# Patient Record
Sex: Male | Born: 1975
Health system: Southern US, Community
[De-identification: ages and names within clinical notes are randomized; demographics above are authoritative.]

## PROBLEM LIST (undated history)

## (undated) DIAGNOSIS — B181 Chronic viral hepatitis B without delta-agent: Secondary | ICD-10-CM

## (undated) HISTORY — PX: HERNIA REPAIR: SHX51

## (undated) HISTORY — DX: Chronic viral hepatitis B without delta-agent: B18.1

---

## 2011-03-28 ENCOUNTER — Ambulatory Visit: Payer: BC Managed Care – PPO

## 2011-03-28 ENCOUNTER — Emergency Department (HOSPITAL_COMMUNITY)
Admission: EM | Admit: 2011-03-28 | Discharge: 2011-03-28 | Disposition: A | Discharge status: Home / Self Care | Payer: BC Managed Care – PPO | Source: Home / Self Care | Attending: Family Medicine | Admitting: Family Medicine

## 2011-03-29 ENCOUNTER — Other Ambulatory Visit: Payer: Self-pay | Admitting: Family Medicine

## 2011-10-20 ENCOUNTER — Ambulatory Visit (INDEPENDENT_AMBULATORY_CARE_PROVIDER_SITE_OTHER): Payer: BC Managed Care – PPO | Admitting: Family Medicine

## 2011-10-20 VITALS — BP 108/64 | HR 104 | Temp 98.3°F | Resp 18 | Ht 67.0 in | Wt 160.0 lb

## 2011-10-20 DIAGNOSIS — R748 Abnormal levels of other serum enzymes: Secondary | ICD-10-CM

## 2011-10-20 DIAGNOSIS — D72819 Decreased white blood cell count, unspecified: Secondary | ICD-10-CM

## 2011-10-20 DIAGNOSIS — B3749 Other urogenital candidiasis: Secondary | ICD-10-CM

## 2011-10-20 DIAGNOSIS — R946 Abnormal results of thyroid function studies: Secondary | ICD-10-CM

## 2011-10-20 DIAGNOSIS — B3789 Other sites of candidiasis: Secondary | ICD-10-CM

## 2011-10-20 DIAGNOSIS — R21 Rash and other nonspecific skin eruption: Secondary | ICD-10-CM

## 2011-10-20 LAB — COMPREHENSIVE METABOLIC PANEL
Albumin: 4.6 g/dL (ref 3.5–5.2)
BUN: 9 mg/dL (ref 6–23)
Calcium: 9.6 mg/dL (ref 8.4–10.5)
Chloride: 104 mEq/L (ref 96–112)
Glucose, Bld: 81 mg/dL (ref 70–99)
Potassium: 4.3 mEq/L (ref 3.5–5.3)

## 2011-10-20 LAB — POCT CBC
Granulocyte percent: 50.1 %G (ref 37–80)
HCT, POC: 50 % (ref 43.5–53.7)
MCH, POC: 25.9 pg — AB (ref 27–31.2)
MCV: 81.9 fL (ref 80–97)
POC LYMPH PERCENT: 40.3 %L (ref 10–50)
RBC: 6.11 M/uL (ref 4.69–6.13)
RDW, POC: 15.3 %
WBC: 5.3 10*3/uL (ref 4.6–10.2)

## 2011-10-20 LAB — TSH: TSH: 0.971 u[IU]/mL (ref 0.350–4.500)

## 2011-10-20 LAB — POCT SKIN KOH: Skin KOH, POC: NEGATIVE

## 2011-10-20 MED ORDER — NYSTATIN 100000 UNIT/GM EX CREA: TOPICAL_CREAM | Freq: Two times a day (BID) | CUTANEOUS | Status: DC

## 2011-10-20 NOTE — Patient Instructions (Addendum)
1. Rash  POCT Skin KOH, Comprehensive metabolic panel  2. Thyroid function test abnormal  Comprehensive metabolic panel, TSH  3. Leukocytopenia  POCT CBC, Comprehensive metabolic panel, TSH  4. Candidiasis of anus    5. Candidiasis of urogenital sites  nystatin cream (MYCOSTATIN)  6. Elevated alkaline phosphatase level     Candida Infection, Adult A candida infection (also called yeast, fungus and Monilia infection) is an overgrowth of yeast that can occur anywhere on the body. A yeast infection commonly occurs in warm, moist body areas. Usually, the infection remains localized but can spread to become a systemic infection. A yeast infection may be a sign of a more severe disease such as diabetes, leukemia, or AIDS. A yeast infection can occur in both men and women. In women, Candida vaginitis is a vaginal infection. It is one of the most common causes of vaginitis. Men usually do not have symptoms or know they have an infection until other problems develop. Men may find out they have a yeast infection because their sex partner has a yeast infection. Uncircumcised men are more likely to get a yeast infection than circumcised men. This is because the uncircumcised glans is not exposed to air and does not remain as dry as that of a circumcised glans. Older adults may develop yeast infections around dentures. CAUSES  Women  Antibiotics.   Steroid medication taken for a long time.   Being overweight (obese).   Diabetes.   Poor immune condition.   Certain serious medical conditions.   Immune suppressive medications for organ transplant patients.   Chemotherapy.   Pregnancy.   Menstration.   Stress and fatigue.   Intravenous drug use.   Oral contraceptives.   Wearing tight-fitting clothes in the crotch area.   Catching it from a sex partner who has a yeast infection.   Spermicide.   Intravenous, urinary, or other catheters.  Men  Catching it from a sex partner who has a  yeast infection.   Having oral or anal sex with a person who has the infection.   Spermicide.   Diabetes.   Antibiotics.   Poor immune system.   Medications that suppress the immune system.   Intravenous drug use.   Intravenous, urinary, or other catheters.  SYMPTOMS  Women  Thick, white vaginal discharge.   Vaginal itching.   Redness and swelling in and around the vagina.   Irritation of the lips of the vagina and perineum.   Blisters on the vaginal lips and perineum.   Painful sexual intercourse.   Low blood sugar (hypoglycemia).   Painful urination.   Bladder infections.   Intestinal problems such as constipation, indigestion, bad breath, bloating, increase in gas, diarrhea, or loose stools.  Men  Men may develop intestinal problems such as constipation, indigestion, bad breath, bloating, increase in gas, diarrhea, or loose stools.   Dry, cracked skin on the penis with itching or discomfort.   Jock itch.   Dry, flaky skin.   Athlete's foot.   Hypoglycemia.  DIAGNOSIS  Women  A history and an exam are performed.   The discharge may be examined under a microscope.   A culture may be taken of the discharge.  Men  A history and an exam are performed.   Any discharge from the penis or areas of cracked skin will be looked at under the microscope and cultured.   Stool samples may be cultured.  TREATMENT  Women  Vaginal antifungal suppositories and creams.  Medicated creams to decrease irritation and itching on the outside of the vagina.   Warm compresses to the perineal area to decrease swelling and discomfort.   Oral antifungal medications.   Medicated vaginal suppositories or cream for repeated or recurrent infections.   Wash and dry the irritation areas before applying the cream.   Eating yogurt with lactobacillus may help with prevention and treatment.   Sometimes painting the vagina with gentian violet solution may help if creams  and suppositories do not work.  Men  Antifungal creams and oral antifungal medications.   Sometimes treatment must continue for 30 days after the symptoms go away to prevent recurrence.  HOME CARE INSTRUCTIONS  Women  Use cotton underwear and avoid tight-fitting clothing.   Avoid colored, scented toilet paper and deodorant tampons or pads.   Do not douche.   Keep your diabetes under control.   Finish all the prescribed medications.   Keep your skin clean and dry.   Consume milk or yogurt with lactobacillus active culture regularly. If you get frequent yeast infections and think that is what the infection is, there are over-the-counter medications that you can get. If the infection does not show healing in 3 days, talk to your caregiver.   Tell your sex partner you have a yeast infection. Your partner may need treatment also, especially if your infection does not clear up or recurs.  Men  Keep your skin clean and dry.   Keep your diabetes under control.   Finish all prescribed medications.   Tell your sex partner that you have a yeast infection so they can be treated if necessary.  SEEK MEDICAL CARE IF:   Your symptoms do not clear up or worsen in one week after treatment.   You have an oral temperature above 102 F (38.9 C).   You have trouble swallowing or eating for a prolonged time.   You develop blisters on and around your vagina.   You develop vaginal bleeding and it is not your menstrual period.   You develop abdominal pain.   You develop intestinal problems as mentioned above.   You get weak or lightheaded.   You have painful or increased urination.   You have pain during sexual intercourse.  MAKE SURE YOU:   Understand these instructions.   Will watch your condition.   Will get help right away if you are not doing well or get worse.  Document Released: 02/20/2004 Document Revised: 01/01/2011 Document Reviewed: 06/03/2009 [facility] Patient  Information 2012 [place], [place].

## 2011-10-20 NOTE — Progress Notes (Signed)
[address]   [place], [place]  [address]   [phone]  Subjective:    Patient ID: Jonathon Miles, male    DOB: Jun 30, 1975, 36 y.o.   MRN: [id number]  HPIThis 36 y.o. male presents for evaluation of the following:  1.  Anal itching:  Onset 4-5 months; used cream from home with pain with use.  No bleeding with bowel movement.  +itching during class.  No nighttime itching.  Works at night.  No worsening during sleeping hours.  Lives alone.  No apparent rash.  No use of Tucks pads or Preparation H.   Also has itchy rash along R groin region.  2.  Labwork:  Requesting labs.  At last visit, checked labs and reported that sugar elevated.  Last visit 07/27/2010.  After review of last labs 07/2010, alkaline phosphatase abnormal, TSH abnormally low, WBC slightly low.  Last HIV testing five years ago before moving to [place];result was negative.  PMH:  None Psurg: SBO surgery. 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  One child.   Review of Systems  Constitutional: Negative for fever, diaphoresis and fatigue.  Gastrointestinal: Negative for nausea, vomiting, abdominal pain, diarrhea, constipation, blood in stool, anal bleeding and rectal pain.  Skin: Negative for color change, pallor, rash and wound.       Objective:   Physical Exam  Nursing note and vitals reviewed. Constitutional: He is oriented to person, place, and time. He appears well-developed and well-nourished. No distress.  Eyes: Conjunctivae normal are normal. Pupils are equal, round, and reactive to light.  Cardiovascular: Normal rate, regular rhythm and normal heart sounds.   Pulmonary/Chest: Effort normal and breath sounds normal. No respiratory distress. He has no wheezes. He has no rales.  Abdominal: Soft. Bowel sounds are normal. He exhibits no distension and no mass. There is no tenderness.  There is no rebound and no guarding.  Genitourinary: Rectal exam shows no external hemorrhoid.  Neurological: He is alert and oriented to person, place, and time.  Skin: He is not diaphoretic.       WHITE SCALING RASH PERIANAL REGION WITH WELL DEFINED BORDERS; NO ASSOCIATED HEMORRHOIDS.  HYPOPIGMENTED/WHITE RASH R INGUINAL/GROIN REGION; NO VESICLES OR PUSTULES.  Psychiatric: He has a normal mood and affect. His behavior is normal. Judgment and thought content normal.     Results for orders placed in visit on 10/20/11  POCT SKIN KOH      Component Value Range   Skin KOH, POC Negative    POCT CBC      Component Value Range   WBC 5.3  4.6 - 10.2 K/uL   Lymph, poc 2.1  0.6 - 3.4   POC LYMPH PERCENT 40.3  10 - 50 %L   MID (cbc) 0.5  0 - 0.9   POC MID % 9.6  0 - 12 %M   POC Granulocyte 2.7  2 - 6.9   Granulocyte percent 50.1  37 - 80 %G   RBC 6.11  4.69 - 6.13 M/uL   Hemoglobin 15.8  14.1 - 18.1 g/dL   HCT, POC 53.6  64.4 - 53.7 %   MCV 81.9  80 - 97 fL   MCH, POC 25.9 (*) 27 - 31.2 pg   MCHC 31.6 (*) 31.8 - 35.4 g/dL   RDW, POC 03.4     Platelet Count, POC 176  142 - 424 K/uL   MPV 10.2  0 - 99.8 fL        Assessment & Plan:   1. Rash  POCT Skin KOH, Comprehensive metabolic panel  2. Thyroid function test abnormal  Comprehensive metabolic panel, TSH  3. Leukocytopenia  POCT CBC, Comprehensive metabolic panel, TSH  4. Candidiasis of anus    5. Candidiasis of urogenital sites  nystatin cream (MYCOSTATIN)  6. Elevated alkaline phosphatase level       1.  Candidiasis Anus: New.  Treat with Nystatin cream for two weeks; RTC in two weeks if no improvement. 2.  Candidiasis Urogenital region/R groin: New.  Treat with Nystatin cream. 3.  Thyroid function abnormal: New in 07/2010.  Repeat labs today. Asymptomatic. 4. Leukocytopenia: New in 07/2010.  Repeat today normal. 5.  Elevated alkaline phosphatase: New in 2012.  Repeat today.

## 2011-10-25 NOTE — Progress Notes (Signed)
Reviewed and agree.

## 2011-10-28 ENCOUNTER — Encounter: Payer: Self-pay | Admitting: Family Medicine

## 2012-10-03 ENCOUNTER — Ambulatory Visit (INDEPENDENT_AMBULATORY_CARE_PROVIDER_SITE_OTHER): Payer: BC Managed Care – PPO | Admitting: Emergency Medicine

## 2012-10-03 ENCOUNTER — Telehealth: Payer: Self-pay | Admitting: Radiology

## 2012-10-03 VITALS — BP 118/70 | HR 67 | Temp 97.1°F | Resp 16

## 2012-10-03 DIAGNOSIS — R21 Rash and other nonspecific skin eruption: Secondary | ICD-10-CM

## 2012-10-03 DIAGNOSIS — J309 Allergic rhinitis, unspecified: Secondary | ICD-10-CM

## 2012-10-03 LAB — POCT SKIN KOH: Skin KOH, POC: NEGATIVE

## 2012-10-03 MED ORDER — FLUTICASONE PROPIONATE 50 MCG/ACT NA SUSP: 2.0000 | Freq: Every day | NASAL | Status: DC

## 2012-10-03 MED ORDER — TRIAMCINOLONE 0.1 % CREAM:EUCERIN CREAM 1:1: 1.0000 "application " | TOPICAL_CREAM | Freq: Two times a day (BID) | CUTANEOUS | Status: DC | PRN

## 2012-10-03 NOTE — Patient Instructions (Addendum)

## 2012-10-03 NOTE — Progress Notes (Signed)
  Subjective:    Patient ID: Jonathon Miles, male    DOB: 05-09-1975, 37 y.o.   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 Has also had cough. Has tried ibuprofen w/o relief. Is having trouble with stomach making noises. Denies any nausea.   Patient also complains of feeling itchy all over. Was seen for this last year and prescribed nystatin cream which helped some. Has even seen a dermatologist for this and was told it was dry skin. Denies any changes in diet, soaps, detergents. Started on legs and spread all over.   Review of Systems     Objective:   Physical Exam HEENT exam is unremarkable there is some mild nasal congestion his chest is clear his heart regular rate no murmurs abdomen soft nontender extremity exam reveals a dry scaly patch right posterior calf just below the popliteal fossa.  Results for orders placed in visit on 10/03/12  POCT SKIN KOH      Result Value Range   Skin KOH, POC Negative          Assessment & Plan:  We'll treat with use and triamcinolone mixed for the rash on his right leg. I advised him to get Claritin to take 1 daily along with some Flonase spray

## 2012-10-03 NOTE — Telephone Encounter (Signed)
Clarified triamcinolone.

## 2013-06-01 ENCOUNTER — Ambulatory Visit (INDEPENDENT_AMBULATORY_CARE_PROVIDER_SITE_OTHER): Payer: BC Managed Care – PPO | Admitting: Family Medicine

## 2013-06-01 ENCOUNTER — Ambulatory Visit: Payer: BC Managed Care – PPO

## 2013-06-01 VITALS — BP 124/80 | HR 69 | Temp 98.6°F | Resp 16 | Ht 66.5 in | Wt 172.0 lb

## 2013-06-01 DIAGNOSIS — B3749 Other urogenital candidiasis: Secondary | ICD-10-CM

## 2013-06-01 DIAGNOSIS — M549 Dorsalgia, unspecified: Secondary | ICD-10-CM

## 2013-06-01 DIAGNOSIS — R21 Rash and other nonspecific skin eruption: Secondary | ICD-10-CM

## 2013-06-01 LAB — POCT UA - MICROSCOPIC ONLY
Bacteria, U Microscopic: NEGATIVE
Casts, Ur, LPF, POC: NEGATIVE
Crystals, Ur, HPF, POC: NEGATIVE
Mucus, UA: NEGATIVE
RBC, urine, microscopic: NEGATIVE
Yeast, UA: NEGATIVE

## 2013-06-01 LAB — POCT URINALYSIS DIPSTICK
Bilirubin, UA: NEGATIVE
Blood, UA: NEGATIVE
Glucose, UA: NEGATIVE
Ketones, UA: NEGATIVE
Leukocytes, UA: NEGATIVE
Nitrite, UA: NEGATIVE
Protein, UA: NEGATIVE
Spec Grav, UA: 1.02
Urobilinogen, UA: 0.2
pH, UA: 7

## 2013-06-01 MED ORDER — NYSTATIN 100000 UNIT/GM EX POWD: CUTANEOUS | Status: DC

## 2013-06-01 MED ORDER — NYSTATIN 100000 UNIT/GM EX CREA: TOPICAL_CREAM | Freq: Every day | CUTANEOUS | Status: DC

## 2013-06-01 NOTE — Progress Notes (Signed)
 Chief Complaint:  Chief Complaint  Patient presents with  . Back Pain    Lower, X 6 months    HPI: Jonathon Miles is a 38 y.o. male who is here for: 1. Lower back pain , intermittent, has been occurring for last 6 months, dull with sharp pain in low back pain, he works Location managermachine operator, he has to lift  A lot, when this happens at work he stops and waits and it will go away in several seconds to minutes. He has tried nothing for , he has no urinary sxs, he would like to check   2. He has groin itching, no other sxs. He sweats some, not excessive. No swollen glands, fevers or chills  History reviewed. No pertinent past medical history. History reviewed. No pertinent past surgical history. History   Social History  . Marital Status: Single    Spouse Name: N/A    Number of Children: N/A  . Years of Education: N/A   Social History Main Topics  . Smoking status: Never Smoker   . Smokeless tobacco: None  . Alcohol Use: No  . Drug Use: No  . Sexual Activity: None   Other Topics Concern  . None   Social History Narrative  . None   History reviewed. No pertinent family history. No Known Allergies Prior to Admission medications   Medication Sig Start Date End Date Taking? Authorizing Provider  fluticasone (FLONASE) 50 MCG/ACT nasal spray Place 2 sprays into the nose daily. 10/03/12   Collene GobbleSteven A Daub, MD  nystatin cream (MYCOSTATIN) Apply topically 2 (two) times daily. 10/20/11   Ethelda ChickKristi M Smith, MD  Triamcinolone Acetonide (TRIAMCINOLONE 0.1 % CREAM : EUCERIN) CREA Apply 1 application topically 2 (two) times daily as needed. 10/03/12   Collene GobbleSteven A Daub, MD     ROS: The patient denies fevers, chills, night sweats, unintentional weight loss, chest pain, palpitations, wheezing, dyspnea on exertion, nausea, vomiting, abdominal pain, dysuria, hematuria, melena, numbness, weakness, or tingling.  All other systems have been reviewed and were otherwise negative with the exception of  those mentioned in the HPI and as above.    PHYSICAL EXAM: Filed Vitals:   06/01/13 0843  BP: 124/80  Pulse: 69  Temp: 98.6 F (37 C)  Resp: 16   Filed Vitals:   06/01/13 0843  Height: 5' 6.5" (1.689 m)  Weight: 172 lb (78.019 kg)   Body mass index is 27.35 kg/(m^2).  General: Alert, no acute distress HEENT:  Normocephalic, atraumatic, oropharynx patent. EOMI, PERRLA Cardiovascular:  Regular rate and rhythm, no rubs murmurs or gallops.  No Carotid bruits, radial pulse intact. No pedal edema.  Respiratory: Clear to auscultation bilaterally.  No wheezes, rales, or rhonchi.  No cyanosis, no use of accessory musculature GI: No organomegaly, abdomen is soft and non-tender, positive bowel sounds.  No masses. Skin: + bilateral inguinal fungal rash Neurologic: Facial musculature symmetric. Psychiatric: Patient is appropriate throughout our interaction. Lymphatic: No cervical lymphadenopathy Musculoskeletal: Gait intact. No  paramsk tenderness  Full ROM 5/5 strength, 2/2 DTRs No saddle anesthesia Straight leg negative Hip and knee exam--normal    LABS: Results for orders placed in visit on 06/01/13  POCT UA - MICROSCOPIC ONLY      Result Value Ref Range   WBC, Ur, HPF, POC 0-1     RBC, urine, microscopic neg     Bacteria, U Microscopic neg     Mucus, UA neg     Epithelial cells, urine  per micros 0-1     Crystals, Ur, HPF, POC neg     Casts, Ur, LPF, POC neg     Yeast, UA neg    POCT URINALYSIS DIPSTICK      Result Value Ref Range   Color, UA yellow     Clarity, UA clear     Glucose, UA neg     Bilirubin, UA neg     Ketones, UA neg     Spec Grav, UA 1.020     Blood, UA neg     pH, UA 7.0     Protein, UA neg     Urobilinogen, UA 0.2     Nitrite, UA neg     ukocytes, UA Negative       EKG/XRAY:   Primary read [facility]. Min scoliosis vs positioional No fx/dislocation   ASSESSMENT/PLAN: Encounter Diagnoses  Name Primary?  . Back pain  Yes  . Rash and nonspecific skin eruption   . Candidiasis of urogenital sites    Rx nystatin powder in the AM, cream in the PM Back exercises for strengthening F/u prn  Gross sideeffects, risk and benefits, and alternatives of medications d/w patient. Patient is aware that all medications have potential sideeffects and we are unable to predict every sideeffect or drug-drug interaction that may occur.  nell Antuhao P , DO 06/01/2013 10:57 AM

## 2013-10-26 ENCOUNTER — Ambulatory Visit (INDEPENDENT_AMBULATORY_CARE_PROVIDER_SITE_OTHER): Payer: BC Managed Care – PPO | Admitting: Physician Assistant

## 2013-10-26 VITALS — BP 118/78 | HR 72 | Temp 97.6°F | Resp 18 | Ht 68.0 in | Wt 178.0 lb

## 2013-10-26 DIAGNOSIS — Z13220 Encounter for screening for lipoid disorders: Secondary | ICD-10-CM

## 2013-10-26 DIAGNOSIS — Z0000 Encounter for general adult medical examination without abnormal findings: Secondary | ICD-10-CM

## 2013-10-26 DIAGNOSIS — Z131 Encounter for screening for diabetes mellitus: Secondary | ICD-10-CM

## 2013-10-26 LAB — COMPLETE METABOLIC PANEL WITH GFR
ALK PHOS: 82 U/L (ref 39–117)
ALT: 27 U/L (ref 0–53)
AST: 24 U/L (ref 0–37)
Albumin: 4.4 g/dL (ref 3.5–5.2)
BILIRUBIN TOTAL: 0.5 mg/dL (ref 0.2–1.2)
BUN: 9 mg/dL (ref 6–23)
CO2: 29 mEq/L (ref 19–32)
CREATININE: 1.01 mg/dL (ref 0.50–1.35)
Calcium: 9.6 mg/dL (ref 8.4–10.5)
Chloride: 102 mEq/L (ref 96–112)
GFR, Est African American: >89 mL/min
GFR, Est Non African American: >89 mL/min
GLUCOSE: 95 mg/dL (ref 70–99)
Potassium: 3.9 mEq/L (ref 3.5–5.3)
Sodium: 138 mEq/L (ref 135–145)
Total Protein: 7.1 g/dL (ref 6.0–8.3)

## 2013-10-26 LAB — POCT URINALYSIS DIPSTICK
Bilirubin, UA: NEGATIVE
Blood, UA: NEGATIVE
Glucose, UA: NEGATIVE
Ketones, UA: NEGATIVE
LEUKOCYTES UA: NEGATIVE
Nitrite, UA: NEGATIVE
Protein, UA: NEGATIVE
SPEC GRAV UA: 1.01
UROBILINOGEN UA: 0.2
pH, UA: 6

## 2013-10-26 LAB — LIPID PANEL
CHOL/HDL RATIO: 3.9 ratio
Cholesterol: 158 mg/dL (ref 0–200)
HDL: 41 mg/dL (ref >39)
LDL Cholesterol: 99 mg/dL (ref 0–99)
Triglycerides: 88 mg/dL (ref <150)
VLDL: 18 mg/dL (ref 0–40)

## 2013-10-26 LAB — HEMOGLOBIN A1C
Hgb A1c MFr Bld: 6.1 % — ABNORMAL HIGH (ref <5.7)
Mean Plasma Glucose: 128 mg/dL — ABNORMAL HIGH (ref <117)

## 2013-10-26 NOTE — Patient Instructions (Signed)
I will contact you with your lab results as soon as they are available.   If you have not heard from me in 2 weeks, please contact me.  The fastest way to get your results is to register for My Chart (see the instructions on the last page of this printout).   

## 2013-10-26 NOTE — Progress Notes (Signed)
Subjective:    Patient ID: Jonathon Miles, male    DOB: Dec 27, 1975, 38 y.o.   MRN: [id number]  HPI  Pt presents to clinic for a CPE.  His insurance requires him to have this yearly and he needs labs.  He is not having any problems today.  Dentist - about 6 months ago Eye - never Not current sexually active  Review of Systems  Constitutional: Negative.   HENT: Negative.   Eyes: Negative.   Respiratory: Negative.   Cardiovascular: Negative.   Gastrointestinal: Negative.   Endocrine: Negative.   Genitourinary: Negative.   Musculoskeletal: Negative.   Skin: Negative.   Allergic/Immunologic: Negative.   Neurological: Negative.   Hematological: Negative.   Psychiatric/Behavioral: Negative.        Objective:   Physical Exam  Vitals reviewed. Constitutional: He is oriented to person, place, and time. He appears well-developed and well-nourished.  BP 118/78  Pulse 72  Temp(Src) 97.6 F (36.4 C) (Oral)  Resp 18  Ht 5\' 8"  (1.727 m)  Wt 178 lb (80.74 kg)  BMI 27.07 kg/m2  SpO2 98%   HENT:  Head: Normocephalic and atraumatic.  Right Ear: Hearing, tympanic membrane, external ear and ear canal normal.  Left Ear: Hearing, tympanic membrane, external ear and ear canal normal.  Nose: Nose normal.  Mouth/Throat: Uvula is midline, oropharynx is clear and moist and mucous membranes are normal.  Eyes: Conjunctivae and EOM are normal. Pupils are equal, round, and reactive to light.  Neck: Normal range of motion. Neck supple. No mass and no thyromegaly present.  Cardiovascular: Normal rate, regular rhythm and normal heart sounds.   No murmur heard. Pulmonary/Chest: Effort normal and breath sounds normal. He has no wheezes.  Abdominal: Soft. Bowel sounds are normal. Hernia confirmed negative in the right inguinal area and confirmed negative in the left inguinal area.  Genitourinary: Testes normal and penis normal. Circumcised.  Musculoskeletal: Normal range of motion.    Lymphadenopathy:       Head (right side): No tonsillar and no occipital adenopathy present.       Head (left side): No tonsillar and no occipital adenopathy present.    He has no cervical adenopathy.       Right: No supraclavicular adenopathy present.       Left: No supraclavicular adenopathy present.  Neurological: He is alert and oriented to person, place, and time. He has normal reflexes.  Skin: Skin is warm and dry.     Psychiatric: He has a normal mood and affect. His behavior is normal. Judgment and thought content normal.   Results for orders placed in visit on 10/26/13  POCT URINALYSIS DIPSTICK      Result Value Ref Range   Color, UA yellow     Clarity, UA clear     Glucose, UA neg     Bilirubin, UA neg     Ketones, UA neg     Spec Grav, UA 1.010     Blood, UA neg     pH, UA 6.0     Protein, UA neg     Urobilinogen, UA 0.2     Nitrite, UA neg     Leukocytes, UA Negative         Assessment & Plan:  Annual physical exam - Plan: COMPLETE METABOLIC PANEL WITH GFR, Nicotine/cotinine metabolites, POCT urinalysis dipstick  Screening cholesterol level - Plan: Lipid panel  Screening for diabetes mellitus - Plan: Hemoglobin A1c  Benny LennertSarah Danique Hartsough PA-C  [facility] 10/26/2013 6:42 PM

## 2013-10-31 LAB — NICOTINE/COTININE METABOLITES: Cotinine: <10 ng/mL

## 2013-12-19 ENCOUNTER — Emergency Department (HOSPITAL_COMMUNITY)
Admission: EM | Admit: 2013-12-19 | Discharge: 2013-12-19 | Disposition: A | Discharge status: Home / Self Care | Payer: BC Managed Care – PPO | Source: Home / Self Care | Attending: Family Medicine | Admitting: Family Medicine

## 2013-12-19 ENCOUNTER — Encounter (HOSPITAL_COMMUNITY): Payer: Self-pay | Admitting: Emergency Medicine

## 2013-12-19 DIAGNOSIS — K529 Noninfective gastroenteritis and colitis, unspecified: Secondary | ICD-10-CM

## 2013-12-19 MED ORDER — ONDANSETRON HCL 4 MG/2ML IJ SOLN
INTRAMUSCULAR | Status: AC
  Filled 2013-12-19: qty 2

## 2013-12-19 MED ORDER — SODIUM CHLORIDE 0.9 % IV BOLUS (SEPSIS)
1000.0000 mL | Freq: Once | INTRAVENOUS | Status: AC
  Administered 2013-12-19: 1000 mL via INTRAVENOUS

## 2013-12-19 MED ORDER — ONDANSETRON HCL 4 MG PO TABS: 4.0000 mg | ORAL_TABLET | Freq: Four times a day (QID) | ORAL | Status: DC

## 2013-12-19 MED ORDER — ONDANSETRON HCL 4 MG/2ML IJ SOLN
4.0000 mg | Freq: Once | INTRAMUSCULAR | Status: AC
  Administered 2013-12-19: 4 mg via INTRAVENOUS

## 2013-12-19 NOTE — ED Notes (Signed)
C/o  Acute onset of vomiting and diarrhea last night denies changes in diet.  No sick contacts that he is aware of.    Also c/o rash under neck x 2 wks with color changes.  Denies irriation.

## 2013-12-19 NOTE — ED Provider Notes (Signed)
CSN: [id number]     Arrival date & time 12/19/13  [id number] History   None    Chief Complaint  Patient presents with  . Abdominal Pain  . Rash   (Consider location/radiation/quality/duration/timing/severity/associated sxs/prior Treatment) Patient is a 38 y.o. male presenting with abdominal pain. The history is provided by the patient.  Abdominal Pain Pain location:  Epigastric Pain quality: cramping and sharp   Pain radiates to:  Does not radiate Pain severity:  Mild Onset quality:  Sudden Duration:  1 day Progression:  Worsening Chronicity:  New Relieved by:  None tried Worsened by:  Eating Ineffective treatments:  None tried Associated symptoms: anorexia, diarrhea, nausea and vomiting   Associated symptoms: no chills, no fever and no hematochezia     History reviewed. No pertinent past medical history. Past Surgical History  Procedure Laterality Date  . Hernia repair Left    History reviewed. No pertinent family history. History  Substance Use Topics  . Smoking status: Never Smoker   . Smokeless tobacco: Not on file  . Alcohol Use: No    Review of Systems  Constitutional: Negative for fever and chills.  Respiratory: Negative.   Cardiovascular: Negative.   Gastrointestinal: Positive for nausea, vomiting, abdominal pain, diarrhea and anorexia. Negative for blood in stool and hematochezia.    Allergies  Review of patient's allergies indicates no known allergies.  Home Medications   Prior to Admission medications   Medication Sig Start Date End Date Taking? Authorizing Provider  ondansetron (ZOFRAN) 4 MG tablet Take 1 tablet (4 mg total) by mouth every 6 (six) hours. Prn n/v. 12/19/13   Linna HoffJames D Kindl, MD   BP 176/81 mmHg  Pulse 71  Temp(Src) 98 F (36.7 C) (Oral)  Resp 16  SpO2 96% Physical Exam  Constitutional: He is oriented to person, place, and time. He appears well-developed and well-nourished.  HENT:  Mouth/Throat: Oropharynx is clear and moist. Mucous  membranes are dry.  Eyes: EOM are normal. Pupils are equal, round, and reactive to light.  Neck: Normal range of motion. Neck supple.  Pulmonary/Chest: Effort normal and breath sounds normal.  Abdominal: Soft. Normal appearance. He exhibits no distension and no mass. Bowel sounds are decreased. There is tenderness in the epigastric area. There is no rigidity, no rebound, no guarding and no CVA tenderness.  Lymphadenopathy:    He has no cervical adenopathy.  Neurological: He is alert and oriented to person, place, and time.  Skin: Skin is warm and dry.  Nursing note and vitals reviewed.   ED Course  Procedures (including critical care time) Labs Review Labs Reviewed - No data to display  Imaging Review No results found.   MDM   1. Gastroenteritis, acute    Sx much improved after ivf, vomiting resolved.    Linna HoffJames D Kindl, MD 12/19/13 [phone]

## 2013-12-19 NOTE — Discharge Instructions (Signed)
Clear liquid , bland diet tonight as tolerated, gatorade and soup, advance on wed as improved, use medicine as needed, imodium for diarrhea. return or see your doctor if any problems.

## 2014-11-30 ENCOUNTER — Ambulatory Visit (INDEPENDENT_AMBULATORY_CARE_PROVIDER_SITE_OTHER): Payer: BLUE CROSS/BLUE SHIELD | Admitting: Family Medicine

## 2014-11-30 ENCOUNTER — Encounter: Payer: Self-pay | Admitting: Family Medicine

## 2014-11-30 VITALS — BP 109/88 | HR 80 | Temp 98.2°F | Ht 68.0 in | Wt 174.0 lb

## 2014-11-30 DIAGNOSIS — Z23 Encounter for immunization: Secondary | ICD-10-CM | POA: Diagnosis not present

## 2014-11-30 DIAGNOSIS — R7303 Prediabetes: Secondary | ICD-10-CM | POA: Diagnosis not present

## 2014-11-30 DIAGNOSIS — Z0000 Encounter for general adult medical examination without abnormal findings: Secondary | ICD-10-CM

## 2014-11-30 LAB — COMPREHENSIVE METABOLIC PANEL
ALT: 23 U/L (ref 9–46)
AST: 23 U/L (ref 10–40)
Albumin: 4.2 g/dL (ref 3.6–5.1)
Alkaline Phosphatase: 83 U/L (ref 40–115)
BUN: 11 mg/dL (ref 7–25)
CHLORIDE: 101 mmol/L (ref 98–110)
CO2: 31 mmol/L (ref 20–31)
Calcium: 9.5 mg/dL (ref 8.6–10.3)
Creat: 1.05 mg/dL (ref 0.60–1.35)
Glucose, Bld: 97 mg/dL (ref 65–99)
Potassium: 4.4 mmol/L (ref 3.5–5.3)
Sodium: 136 mmol/L (ref 135–146)
Total Bilirubin: 0.6 mg/dL (ref 0.2–1.2)
Total Protein: 6.8 g/dL (ref 6.1–8.1)

## 2014-11-30 LAB — LIPID PANEL
CHOL/HDL RATIO: 4 ratio (ref <=5.0)
Cholesterol: 144 mg/dL (ref 125–200)
HDL: 36 mg/dL — ABNORMAL LOW (ref >=40)
LDL CALC: 92 mg/dL (ref <130)
Triglycerides: 80 mg/dL (ref <150)
VLDL: 16 mg/dL (ref <30)

## 2014-11-30 LAB — POCT GLYCOSYLATED HEMOGLOBIN (HGB A1C): Hemoglobin A1C: 6

## 2014-11-30 NOTE — Patient Instructions (Addendum)
Checking labs Follow up in 1 year, sooner if needed.  Be well, Dr. Pollie MeyerMcIntyre

## 2014-11-30 NOTE — Progress Notes (Signed)
  HPI:  Patient presents today for a new patient appointment to establish general primary care, also to have a complete physical.  Patient has no specific concerns today. Reports he is up to date on his vaccines. His insurance requires him to get a physical each year. He is originally from [place], primarily language is [place] but he speaks fluent [place]. Works full time, and is a Engineer, maintenance (IT)college graduate. Llives with wife, 40 year old son, and 267 week old daughter. Owns a car. Runs regularly for exercise. No tobacco, drug, or alcohol use. Feels safe in his relationships. No history concerning for depression. Has not been to dentist, would like dental list.   No significant PMHx. Had inguinal hernia surgery in 2006. Does not take any medicines regularly.  ROS: See HPI  Past Medical Hx:  -prediabetes (noted on labs from last year, A1c was 6.1)  Past Surgical Hx:  -inguinal hernia surgery in 2006 in [place]  Family Hx: updated in Epic  Health Maintenance:  -reports vaccines are up to date (not visible in [facility])  PHYSICAL EXAM: BP 109/88 mmHg  Pulse 80  Temp(Src) 98.2 F (36.8 C) (Oral)  Ht 5\' 8"  (1.727 m)  Wt 174 lb (78.926 kg)  BMI 26.46 kg/m2 Gen: NAD, pleasant, cooperative HEENT: normocephalic, atraumatic. No anterior cervical lymphadenopathy Heart: regular rate and rhythm no murmur Lungs: clear to auscultation bilaterally, normal work of breathing  Abdomen: soft, nontender to palpation Neuro: grossly nonfocal, speech normal Extremities: No appreciable lower extremity edema bilaterally   ASSESSMENT/PLAN:  # Health maintenance:  -check routine labs today: CMET, lipids, A1c -flu shot given today   FOLLOW UP: F/u in 1 year for physical, sooner if needed.  GrenadaBrittany J. Pollie MeyerMcIntyre, MD [facility]

## 2014-12-05 DIAGNOSIS — R7303 Prediabetes: Secondary | ICD-10-CM | POA: Insufficient documentation

## 2014-12-14 ENCOUNTER — Encounter: Payer: Self-pay | Admitting: Family Medicine

## 2015-05-30 ENCOUNTER — Ambulatory Visit (INDEPENDENT_AMBULATORY_CARE_PROVIDER_SITE_OTHER): Payer: BLUE CROSS/BLUE SHIELD | Admitting: Family Medicine

## 2015-05-30 ENCOUNTER — Encounter: Payer: Self-pay | Admitting: Family Medicine

## 2015-05-30 VITALS — BP 127/83 | HR 62 | Temp 97.8°F | Wt 168.0 lb

## 2015-05-30 DIAGNOSIS — M546 Pain in thoracic spine: Secondary | ICD-10-CM | POA: Diagnosis not present

## 2015-05-30 DIAGNOSIS — Z1159 Encounter for screening for other viral diseases: Secondary | ICD-10-CM

## 2015-05-30 DIAGNOSIS — J069 Acute upper respiratory infection, unspecified: Secondary | ICD-10-CM

## 2015-05-30 DIAGNOSIS — B9789 Other viral agents as the cause of diseases classified elsewhere: Secondary | ICD-10-CM

## 2015-05-30 MED ORDER — HYDROCODONE-HOMATROPINE 5-1.5 MG/5ML PO SYRP
5.0000 mL | ORAL_SOLUTION | Freq: Three times a day (TID) | ORAL | Status: DC | PRN
Start: 2015-05-30 — End: 2018-01-14

## 2015-05-30 NOTE — Progress Notes (Signed)
Date of Visit: 05/30/2015   HPI:  Patient presents for a same day appointment to discuss pain in L upper back. Present for four days. Tuesday PM it was really bad, but has backed off some the last few days. Has used icyhot and bengay both of which help. Eating and drinking well. Stooling and urinating normally. No prior history of this in the past. He has had a cold recently for about 1 week and is coughing a lot. Cough is dry, nonproductive. He was very worried that the pain in his back could be from his liver since he was never tested for viral hepatitis. Pain wraps around to his L flank as well. Worse with turning certain directions, also with coughing.  ROS: See HPI  PMFSH: history of prediabetes  PHYSICAL EXAM: BP 127/83 mmHg  Pulse 62  Temp(Src) 97.8 F (36.6 C) (Oral)  Wt 168 lb (76.204 kg) Gen: NAD, pleasant, cooperative HEENT: normocephalic, atraumatic, moist mucous membranes, tympanic membranes clear bilaterally, nares patent, oropharynx clear and moist. Tongue with nodule present (chronic per patient, after biting his tongue, not enlarging). Heart: regular rate and rhythm, no murmur Lungs: clear to auscultation bilaterally, normal work of breathing,  Abdomen: soft, nontender to palpation  Neuro: alert, grossly nonfocal Back: left upper back nontender to palpation. No skin lesions present.  ASSESSMENT/PLAN:  1. Back pain - appears musculoskeletal. Recommend ibuprofen, heating pad. Will rx cough syrup which will likely help as well. Follow up if not improving. reasurred patient that liver is on R side and that this is unlikely to be due to his liver. Will check hepatitis panel today to hopefully reassure him further.  2. Cough/URI - rx hycodan syrup. Counseled on risk of sedation. No signs of bacterial superinfection presently.   FOLLOW UP: Follow up as needed if symptoms worsen or fail to improve.    GrenadaBrittany J. Pollie MeyerMcIntyre, MD [facility]

## 2015-05-30 NOTE — Patient Instructions (Addendum)
Use heating pad on your back Can also try ibuprofen Giving you some cough medicine - use caution as it might make you sleepy  Checking hepatitis panel today. I'll call you or send you a letter with results. Follow up if not improving in the next 1-2 weeks  Be well, Dr. Pollie MeyerMcIntyre   Muscle Strain A muscle strain is an injury that occurs when a muscle is stretched beyond its normal length. Usually a small number of muscle fibers are torn when this happens. Muscle strain is rated in degrees. First-degree strains have the least amount of muscle fiber tearing and pain. Second-degree and third-degree strains have increasingly more tearing and pain.  Usually, recovery from muscle strain takes 1-2 weeks. Complete healing takes 5-6 weeks.  CAUSES  Muscle strain happens when a sudden, violent force placed on a muscle stretches it too far. This may occur with lifting, sports, or a fall.  RISK FACTORS Muscle strain is especially common in athletes.  SIGNS AND SYMPTOMS At the site of the muscle strain, there may be:  Pain.  Bruising.  Swelling.  Difficulty using the muscle due to pain or lack of normal function. DIAGNOSIS  Your health care provider will perform a physical exam and ask about your medical history. TREATMENT  Often, the best treatment for a muscle strain is resting, icing, and applying cold compresses to the injured area.  HOME CARE INSTRUCTIONS   Use the PRICE method of treatment to promote muscle healing during the first 2-3 days after your injury. The PRICE method involves:  Protecting the muscle from being injured again.  Restricting your activity and resting the injured body part.  Icing your injury. To do this, put ice in a plastic bag. Place a towel between your skin and the bag. Then, apply the ice and leave it on from 15-20 minutes each hour. After the third day, switch to moist heat packs.  Apply compression to the injured area with a splint or elastic bandage. Be  careful not to wrap it too tightly. This may interfere with blood circulation or increase swelling.  Elevate the injured body part above the level of your heart as often as you can.  Only take over-the-counter or prescription medicines for pain, discomfort, or fever as directed by your health care provider.  Warming up prior to exercise helps to prevent future muscle strains. SEEK MEDICAL CARE IF:   You have increasing pain or swelling in the injured area.  You have numbness, tingling, or a significant loss of strength in the injured area. MAKE SURE YOU:   Understand these instructions.  Will watch your condition.  Will get help right away if you are not doing well or get worse.   This information is not intended to replace advice given to you by your health care provider. Make sure you discuss any questions you have with your health care provider.   Document Released: 01/12/2005 Document Revised: 11/02/2012 Document Reviewed: 08/11/2012 Elsevier Interactive Patient Education [employer]2016 Elsevier Inc.

## 2015-05-31 LAB — HEPATITIS PANEL, ACUTE
HCV AB: NEGATIVE
HEP B S AG: POSITIVE — AB
Hep A IgM: NONREACTIVE
Hep B C IgM: NONREACTIVE

## 2015-05-31 LAB — HEPATITIS B SURF AG CONFIRMATION: HEPATITIS B SURFACE ANTIGEN CONFIRMATION: POSITIVE — AB

## 2015-06-04 ENCOUNTER — Telehealth: Payer: Self-pay | Admitting: Family Medicine

## 2015-06-04 DIAGNOSIS — R768 Other specified abnormal immunological findings in serum: Secondary | ICD-10-CM

## 2015-06-04 NOTE — Telephone Encounter (Signed)
Need results.

## 2015-06-05 DIAGNOSIS — B1910 Unspecified viral hepatitis B without hepatic coma: Secondary | ICD-10-CM | POA: Insufficient documentation

## 2015-06-05 NOTE — Telephone Encounter (Signed)
Patient is returning call from provider. Jazmin Hartsell,CMA

## 2015-06-05 NOTE — Telephone Encounter (Signed)
Called patient to discuss + Hep B surface antigen With negative core IgM antibody, suggestive of chronic infection.  I spoke with Dr. Orvan Falconerampbell of [facility] who recommendations obtaining: -hep B e antigen -hep B e antibody -hep B DNA viral load  Will also update LFT's since these were last checked in December (normal at that time). Lab visit scheduled for tomorrow morning at 9am. Will also place urgent referral to [facility], as patient anxious about this dx and would prefer to have appointment scheduled ASAP.  Counseled on risk of sexual transmission and recommended using condoms at least until seen by ID and can discuss further.  He was appropriately concerned about his wife and children. I will arrange for them all to be tested as well, as they are my patients.  Latrelle DodrillBrittany J Aero Drummonds, MD

## 2015-06-06 ENCOUNTER — Other Ambulatory Visit: Payer: BLUE CROSS/BLUE SHIELD

## 2015-06-06 DIAGNOSIS — R768 Other specified abnormal immunological findings in serum: Secondary | ICD-10-CM

## 2015-06-06 LAB — COMPLETE METABOLIC PANEL WITH GFR
ALT: 23 U/L (ref 9–46)
AST: 22 U/L (ref 10–40)
Albumin: 4.4 g/dL (ref 3.6–5.1)
Alkaline Phosphatase: 75 U/L (ref 40–115)
BUN: 11 mg/dL (ref 7–25)
CALCIUM: 9.4 mg/dL (ref 8.6–10.3)
CHLORIDE: 102 mmol/L (ref 98–110)
CO2: 26 mmol/L (ref 20–31)
Creat: 0.99 mg/dL (ref 0.60–1.35)
Glucose, Bld: 96 mg/dL (ref 65–99)
Potassium: 3.9 mmol/L (ref 3.5–5.3)
Sodium: 139 mmol/L (ref 135–146)
Total Bilirubin: 0.9 mg/dL (ref 0.2–1.2)
Total Protein: 7 g/dL (ref 6.1–8.1)

## 2015-06-10 LAB — HEPATITIS B E ANTIBODY: Hepatitis Be Antibody: REACTIVE — AB

## 2015-06-10 LAB — HEPATITIS B DNA, ULTRAQUANTITATIVE, PCR: Hepatitis B DNA: <20 IU/mL (ref <20)

## 2015-06-10 LAB — HEPATITIS B E ANTIGEN: Hepatitis Be Antigen: NONREACTIVE

## 2015-06-18 ENCOUNTER — Telehealth: Payer: Self-pay | Admitting: Family Medicine

## 2015-06-18 NOTE — Telephone Encounter (Signed)
Late entry - called patient Friday 5/19 to discuss follow up hep B testing LFTs normal PCR <20 but detectable Positive E antibody but neg antigen Advised patient that I would do more research to determine what this means for him  Updates from today 5/23: Discussed with Dr. Ninetta LightsHatcher (on call ID) via phone, he advises patient will not be a candidate for treatment at this time with his low viral load, normal LFTs and negative E antigen. It's fine for him to still be seen in ID clinic for educational purposes.  Attempted to reach patient but no answer. Will try again tomorrow.  Latrelle DodrillBrittany J McIntyre, MD

## 2015-06-19 NOTE — Telephone Encounter (Signed)
Spoke with patient and informed him of above.  Latrelle DodrillBrittany J Maximillion Gill, MD

## 2015-08-19 ENCOUNTER — Ambulatory Visit (INDEPENDENT_AMBULATORY_CARE_PROVIDER_SITE_OTHER): Payer: BLUE CROSS/BLUE SHIELD | Admitting: Infectious Disease

## 2015-08-19 ENCOUNTER — Encounter: Payer: Self-pay | Admitting: Infectious Disease

## 2015-08-19 VITALS — BP 130/80 | HR 82 | Temp 98.2°F | Ht 68.0 in | Wt 169.0 lb

## 2015-08-19 DIAGNOSIS — B162 Acute hepatitis B without delta-agent with hepatic coma: Secondary | ICD-10-CM | POA: Diagnosis not present

## 2015-08-19 DIAGNOSIS — B1911 Unspecified viral hepatitis B with hepatic coma: Secondary | ICD-10-CM

## 2015-08-19 DIAGNOSIS — B181 Chronic viral hepatitis B without delta-agent: Secondary | ICD-10-CM | POA: Diagnosis not present

## 2015-08-19 HISTORY — DX: Chronic viral hepatitis B without delta-agent: B18.1

## 2015-08-19 NOTE — Patient Instructions (Signed)
Hepatitis B °Hepatitis B is a viral infection of the liver. There are two kinds of hepatitis B: °· Acute hepatitis B. Hepatitis B that lasts six months or less is called acute hepatitis B. °· Chronic hepatitis B. Hepatitis B that lasts more than six months is called long-term (chronic) hepatitis B. Chronic hepatitis B can lead to liver failure, scarring of the liver (cirrhosis), or liver cancer. °Acute hepatitis B can turn into chronic hepatitis B. Most adults with acute hepatitis B do not develop chronic hepatitis B. Infants and young children who get hepatitis B are more likely to develop chronic hepatitis B than adults who get hepatitis B. °CAUSES °Hepatitis B is caused by the hepatitis B virus (HBV). The virus is passed from one person to another through blood, birth, sex, or bodily fluids, such as: °· Breast milk. °· Tears. °· Saliva. °RISK FACTORS °The risk factors for hepatitis B include: °· Having unprotected sex with someone who is infected. °· Using drugs with needles. °SIGNS AND SYMPTOMS °Symptoms of hepatitis B may include: °· Loss of appetite. °· Fatigue. °· Nausea. °· Vomiting. °· Stomach pain. °· Dark yellow urine. °· Yellowish skin and eyes (jaundice). °Hepatitis B does not always cause symptoms. °DIAGNOSIS °Your health care provider will do a blood test to diagnose hepatitis B. °TREATMENT °You will need to prevent further injury to the liver by avoiding alcohol and medicines that can be hard for the liver to metabolize. Treatment for chronic hepatitis B may include antiviral medicine. This medicine may help: °· Lower your risk of liver failure. °· Lower your ability to infect others with hepatitis B. °· Lower your risk of scarring your liver (cirrhosis). °· Lower your risk of liver cancer. °HOME CARE INSTRUCTIONS  °· Rest as needed. °· Avoid alcohol. °· Take medicines only as directed by your health care provider. °· Do not take any medicine without approval from your health care provider. This  includes over-the-counter medicine that is usually taken for fever or pain. °· Do not have sex unless approved by your health care provider. °· Do not share toothbrushes, nail clippers, razors, or needles with others. °SEEK IMMEDIATE MEDICAL CARE IF:  °· You are unable to eat or drink. °· You have a fever along with nausea or vomiting. °· You feel confused. °· You develop jaundice or your chronic jaundice becomes more severe. °· You have trouble breathing. °· You develop a rash. °· Your skin, throat, mouth, or face becomes swollen. °· Your body shakes or twitches (seizure). °· You become very sleepy or have trouble waking up. °  °This information is not intended to replace advice given to you by your health care provider. Make sure you discuss any questions you have with your health care provider. °  °Document Released: 01/10/2000 Document Revised: 10/03/2014 Document Reviewed: 04/21/2013 °Elsevier Interactive Patient Education ©2016 Elsevier Inc. ° °

## 2015-08-19 NOTE — Progress Notes (Signed)
Reason for Consult: hepatitis B carrier  Requesting Physician: Dr. Pollie Meyer  Subjective:    Patient ID: Jonathon Miles, male    DOB: [birth year], 40 y.o.   MRN: [id number]  HPI  40 year old man from [place] with hepatitis B surface antigen positive but HBV DNA <20 referred to [place] by PCP for evaluation and management of his hepatitis B carrier state.  He states that his wife is Hep B negative as was recently born child. It seems likely based on our interview and his country of birth that he likely acquired this perinatally.   He is Hep B E ag AB +.  Past Medical History:  Diagnosis Date  . Hepatitis B carrier 08/19/2015    Past Surgical History:  Procedure Laterality Date  . HERNIA REPAIR Left     No family history on file.    Social History   Social History  . Marital status: Single    Spouse name: N/A  . Number of children: N/A  . Years of education: N/A   Social History Main Topics  . Smoking status: Never Smoker  . Smokeless tobacco: None  . Alcohol use No  . Drug use: No  . Sexual activity: Not Currently    Partners: Female   Other Topics Concern  . None   Social History Narrative   From [place]    No Known Allergies   Current Outpatient Prescriptions:  .  acetaminophen (TYLENOL) 500 MG chewable tablet, Chew 500 mg by mouth every 6 (six) hours as needed for pain., Disp: , Rfl:  .  HYDROcodone-homatropine (HYCODAN) 5-1.5 MG/5ML syrup, Take 5 mLs by mouth every 8 (eight) hours as needed for cough. (Patient not taking: Reported on 08/19/2015), Disp: 60 mL, Rfl: 0   Review of Systems  Constitutional: Negative for activity change, appetite change, chills, diaphoresis, fatigue, fever and unexpected weight change.  HENT: Negative for congestion, rhinorrhea, sinus pressure, sneezing, sore throat and trouble swallowing.   Eyes: Negative for photophobia and visual disturbance.  Respiratory: Negative for cough, chest tightness, shortness of breath, wheezing  and stridor.   Cardiovascular: Negative for chest pain, palpitations and leg swelling.  Gastrointestinal: Negative for abdominal distention, abdominal pain, anal bleeding, blood in stool, constipation, diarrhea, nausea and vomiting.  Genitourinary: Negative for difficulty urinating, dysuria, flank pain and hematuria.  Musculoskeletal: Negative for arthralgias, back pain, gait problem, joint swelling and myalgias.  Skin: Negative for color change, pallor, rash and wound.  Neurological: Negative for dizziness, tremors, weakness and light-headedness.  Hematological: Negative for adenopathy. Does not bruise/bleed easily.  Psychiatric/Behavioral: Negative for agitation, behavioral problems, confusion, decreased concentration, dysphoric mood and sleep disturbance.       Objective:   Physical Exam  Constitutional: He is oriented to person, place, and time. He appears well-developed and well-nourished. No distress.  HENT:  Head: Normocephalic and atraumatic.  Mouth/Throat: No oropharyngeal exudate.  Eyes: Conjunctivae and EOM are normal. No scleral icterus.  Neck: Normal range of motion. Neck supple.  Cardiovascular: Normal rate and regular rhythm.   Pulmonary/Chest: Effort normal. No respiratory distress. He has no wheezes.  Abdominal: Soft. He exhibits no distension and no mass. There is no tenderness. There is no rebound and no guarding.  Musculoskeletal: He exhibits no edema or tenderness.  Neurological: He is alert and oriented to person, place, and time. He exhibits normal muscle tone. Coordination normal.  Skin: Skin is warm and dry. No rash noted. He is not diaphoretic. No erythema. No  pallor.  Psychiatric: He has a normal mood and affect. His behavior is normal. Judgment and thought content normal.          Assessment & Plan:    Hepatitis B carrier: will check hep A total ab, HIV 4th generation test  Will screen for [facility] with US liver  rtc in 6 months for repeat LFT's HBV DNA  and surveillance for [facility] with [place]  I spent greater than 40 minutes with the patient including greater than 50% of time in face to face counsel of the patient re his HBV carrier state  and in coordination of his care.

## 2015-08-20 LAB — HEPATITIS A ANTIBODY, TOTAL: HEP A TOTAL AB: REACTIVE — AB

## 2015-08-20 LAB — HIV ANTIBODY (ROUTINE TESTING W REFLEX): HIV: NONREACTIVE

## 2015-08-26 ENCOUNTER — Ambulatory Visit
Admission: RE | Admit: 2015-08-26 | Discharge: 2015-08-26 | Disposition: A | Discharge status: Home / Self Care | Payer: BLUE CROSS/BLUE SHIELD | Source: Ambulatory Visit | Attending: Infectious Disease | Admitting: Infectious Disease

## 2015-08-26 DIAGNOSIS — B181 Chronic viral hepatitis B without delta-agent: Secondary | ICD-10-CM

## 2016-01-10 ENCOUNTER — Encounter: Payer: Self-pay | Admitting: Family Medicine

## 2016-01-10 ENCOUNTER — Ambulatory Visit (INDEPENDENT_AMBULATORY_CARE_PROVIDER_SITE_OTHER): Payer: BLUE CROSS/BLUE SHIELD | Admitting: Family Medicine

## 2016-01-10 VITALS — BP 110/74 | HR 66 | Temp 97.7°F | Ht 68.0 in | Wt 168.8 lb

## 2016-01-10 DIAGNOSIS — R7303 Prediabetes: Secondary | ICD-10-CM

## 2016-01-10 DIAGNOSIS — Z23 Encounter for immunization: Secondary | ICD-10-CM | POA: Diagnosis not present

## 2016-01-10 DIAGNOSIS — B181 Chronic viral hepatitis B without delta-agent: Secondary | ICD-10-CM

## 2016-01-10 DIAGNOSIS — Z0000 Encounter for general adult medical examination without abnormal findings: Secondary | ICD-10-CM

## 2016-01-10 DIAGNOSIS — B354 Tinea corporis: Secondary | ICD-10-CM

## 2016-01-10 LAB — LIPID PANEL
Cholesterol: 146 mg/dL (ref <200)
HDL: 41 mg/dL (ref >40)
LDL CALC: 82 mg/dL (ref <100)
TRIGLYCERIDES: 114 mg/dL (ref <150)
Total CHOL/HDL Ratio: 3.6 Ratio (ref <5.0)
VLDL: 23 mg/dL (ref <30)

## 2016-01-10 LAB — POCT GLYCOSYLATED HEMOGLOBIN (HGB A1C): HEMOGLOBIN A1C: 5.8

## 2016-01-10 MED ORDER — KETOCONAZOLE 2 % EX CREA: 1.0000 "application " | TOPICAL_CREAM | Freq: Every day | CUTANEOUS | 2 refills | Status: DC

## 2016-01-10 NOTE — Progress Notes (Signed)
Jonathon HillierIan McKeag, MD, MS Phone: [phone]  Subjective:  CC -- Annual Physical;  Pt reports he feels well. No issues at this time. Works in [employer]. Has held this job since 2011. Needs employment phys.  Hep B Carrier >> seen by ID, deemed not active. No need for treatment. - he is to f/u with them 6 months from last visit (~January 2018)   Cardiovascular: - Risk as of 11/30/14: 2.4% (assessment every 3-5 years) - Dx Hypertension: no  - Dx Hyperlipidemia: no  - Dx Obesity: no  - Physical Activity: yes, soccer; every weekend  - Diabetes: no, had "pre-DM" A1c level in past. Will recheck today   Cancer: Colorectal >> Colonoscopy: no  Lung >> Tobacco Use: no  Prostate >> Interested in DRE and/or PSA: no  Skin >> Suspicious lesions: yes, posterior neck rash - first noticed about 2 weeks ago. - can itch at times. - seems to be getting wider - scaley   Social: Alcohol Use: no  Tobacco Use: no  Other Drugs: no  Risky Sexual Behavior: no  Depression: no  Support and Life at Home: yes   Other: Osteoporosis: no Zoster Vaccine: no  Flu Vaccine: yes, got from employer  Pneumonia Vaccine: no   ROS- no fever, chills, nausea, vomiting, diarrhea, headache, weight loss, weight gain, fatigue, numbness, weakness, or paresthesias.  Past Medical History Patient Active Problem List   Diagnosis Date Noted  . Healthcare maintenance 01/10/2016  . Tinea corporis 01/10/2016  . Hepatitis B carrier (HCC) 08/19/2015  . Hepatitis B infection 06/05/2015  . Prediabetes 12/05/2014    Medications- reviewed and updated Current Outpatient Prescriptions  Medication Sig Dispense Refill  . acetaminophen (TYLENOL) 500 MG chewable tablet Chew 500 mg by mouth every 6 (six) hours as needed for pain.    Marland Kitchen. HYDROcodone-homatropine (HYCODAN) 5-1.5 MG/5ML syrup Take 5 mLs by mouth every 8 (eight) hours as needed for cough. (Patient not taking: Reported on 08/19/2015) 60 mL 0  . ketoconazole (NIZORAL) 2 % cream  Apply 1 application topically daily. Apply every day for 2 weeks. 15 g 2   No current facility-administered medications for this visit.     Objective: BP 110/74 (BP Location: Left Arm, Patient Position: Sitting, Cuff Size: Normal)   Pulse 66   Temp 97.7 F (36.5 C) (Oral)   Ht 5\' 8"  (1.727 m)   Wt 168 lb 12.8 oz (76.6 kg)   SpO2 98%   BMI 25.67 kg/m  Gen: NAD, alert, cooperative with exam HEENT: NCAT, EOMI, PERRL Integument: Scaly pruritic and circular rash with central clearing noted on the right base of his neck. CV: RRR, good S1/S2, no murmur Resp: CTABL, no wheezes, non-labored Abd: Soft, Non Tender, Non Distended, BS present, no guarding or organomegaly Genital Exam: no penile lesions or discharge, no testicular masses, no inguinal hernias appreciated Ext: No edema, warm Neuro: Alert and oriented, No gross deficits   Assessment/Plan:  Healthcare maintenance Patient is here for a health care maintenance exam. Patient has been doing well. Exercises regularly. Does not drink or smoke. BMI is an ideal category. - Obtain a A1c and lipid panel.  Hepatitis B carrier (HCC) Stable: Followed by ID. Next visit should be around January 2018. - I gave patient a number of his ID physicians office and asked him to call to make an appointment sometime in January or February.  Tinea corporis Patient had signs and symptoms consistent with tinea corporis on the posterior base of his neck. - Ketoconazole  cream daily 2 weeks.   Orders Placed This Encounter  Procedures  . Tdap vaccine greater than or equal to 7yo IM  . Lipid panel  . HgB A1c    Meds ordered this encounter  Medications  . ketoconazole (NIZORAL) 2 % cream    Sig: Apply 1 application topically daily. Apply every day for 2 weeks.    Dispense:  15 g    Refill:  2     Kathee DeltonIan D McKeag, MD,MS,  PGY3 01/10/2016 6:07 PM

## 2016-01-10 NOTE — Assessment & Plan Note (Signed)
Patient is here for a health care maintenance exam. Patient has been doing well. Exercises regularly. Does not drink or smoke. BMI is an ideal category. - Obtain a A1c and lipid panel.

## 2016-01-10 NOTE — Assessment & Plan Note (Addendum)
Patient had signs and symptoms consistent with tinea corporis on the posterior base of his neck. - Ketoconazole cream daily 2 weeks.

## 2016-01-10 NOTE — Patient Instructions (Addendum)
It was a pleasure seeing you today in our clinic. Today we discussed your health maintenance. Here is the treatment plan we have discussed and agreed upon together:  - Everything looks great. I have no issues at this time. Continue staying active and eating the way that you have been. - I will mail you the results of today's labs. - Please contact your infectious disease physician and make an appointment some time towards the end of January 2018.  Dr. Daiva Eves (Infectious Disease) Address: [address], [place], [place] [address] Phone: [phone]   Health Maintenance, Male A healthy lifestyle and preventative care can promote health and wellness.  Maintain regular health, dental, and eye exams.  Eat a healthy diet. Foods like vegetables, fruits, whole grains, low-fat dairy products, and lean protein foods contain the nutrients you need and are low in calories. Decrease your intake of foods high in solid fats, added sugars, and salt. Get information about a proper diet from your health care provider, if necessary.  Regular physical exercise is one of the most important things you can do for your health. Most adults should get at least 150 minutes of moderate-intensity exercise (any activity that increases your heart rate and causes you to sweat) each week. In addition, most adults need muscle-strengthening exercises on 2 or more days a week.   Maintain a healthy weight. The body mass index (BMI) is a screening tool to identify possible weight problems. It provides an estimate of body fat based on height and weight. Your health care provider can find your BMI and can help you achieve or maintain a healthy weight. For males 20 years and older:  A BMI below 18.5 is considered underweight.  A BMI of 18.5 to 24.9 is normal.  A BMI of 25 to 29.9 is considered overweight.  A BMI of 30 and above is considered obese.  Maintain normal blood lipids and cholesterol by exercising and  minimizing your intake of saturated fat. Eat a balanced diet with plenty of fruits and vegetables. Blood tests for lipids and cholesterol should begin at age 16 and be repeated every 5 years. If your lipid or cholesterol levels are high, you are over age 24, or you are at high risk for heart disease, you may need your cholesterol levels checked more frequently.Ongoing high lipid and cholesterol levels should be treated with medicines if diet and exercise are not working.  If you smoke, find out from your health care provider how to quit. If you do not use tobacco, do not start.  Lung cancer screening is recommended for adults aged 55-80 years who are at high risk for developing lung cancer because of a history of smoking. A yearly low-dose CT scan of the lungs is recommended for people who have at least a 30-pack-year history of smoking and are current smokers or have quit within the past 15 years. A pack year of smoking is smoking an average of 1 pack of cigarettes a day for 1 year (for example, a 30-pack-year history of smoking could mean smoking 1 pack a day for 30 years or 2 packs a day for 15 years). Yearly screening should continue until the smoker has stopped smoking for at least 15 years. Yearly screening should be stopped for people who develop a health problem that would prevent them from having lung cancer treatment.  If you choose to drink alcohol, do not have more than 2 drinks per day. One drink is considered to  be 12 oz (360 mL) of beer, 5 oz (150 mL) of wine, or 1.5 oz (45 mL) of liquor.  Avoid the use of street drugs. Do not share needles with anyone. Ask for help if you need support or instructions about stopping the use of drugs.  High blood pressure causes heart disease and increases the risk of stroke. High blood pressure is more likely to develop in:  People who have blood pressure in the end of the normal range (100-139/85-89 mm Hg).  People who are overweight or  obese.  People who are African American.  If you are 7718-41 years of age, have your blood pressure checked every 3-5 years. If you are 41 years of age or older, have your blood pressure checked every year. You should have your blood pressure measured twice-once when you are at a hospital or clinic, and once when you are not at a hospital or clinic. Record the average of the two measurements. To check your blood pressure when you are not at a hospital or clinic, you can use:  An automated blood pressure machine at a pharmacy.  A home blood pressure monitor.  If you are 3345-41 years old, ask your health care provider if you should take aspirin to prevent heart disease.  Diabetes screening involves taking a blood sample to check your fasting blood sugar level. This should be done once every 3 years after age 41 if you are at a normal weight and without risk factors for diabetes. Testing should be considered at a younger age or be carried out more frequently if you are overweight and have at least 1 risk factor for diabetes.  Colorectal cancer can be detected and often prevented. Most routine colorectal cancer screening begins at the age of 41 and continues through age 41. However, your health care provider may recommend screening at an earlier age if you have risk factors for colon cancer. On a yearly basis, your health care provider may provide home test kits to check for hidden blood in the stool. A small camera at the end of a tube may be used to directly examine the colon (sigmoidoscopy or colonoscopy) to detect the earliest forms of colorectal cancer. Talk to your health care provider about this at age 41 when routine screening begins. A direct exam of the colon should be repeated every 5-10 years through age 575, unless early forms of precancerous polyps or small growths are found.  People who are at an increased risk for hepatitis B should be screened for this virus. You are considered at high risk  for hepatitis B if:  You were born in a country where hepatitis B occurs often. Talk with your health care provider about which countries are considered high risk.  Your parents were born in a high-risk country and you have not received a shot to protect against hepatitis B (hepatitis B vaccine).  You have HIV or AIDS.  You use needles to inject street drugs.  You live with, or have sex with, someone who has hepatitis B.  You are a man who has sex with other men (MSM).  You get hemodialysis treatment.  You take certain medicines for conditions like cancer, organ transplantation, and autoimmune conditions.  Hepatitis C blood testing is recommended for all people born from [birth year] through 1965 and any individual with known risk factors for hepatitis C.  Healthy men should no longer receive prostate-specific antigen (PSA) blood tests as part of routine cancer screening. Talk to  your health care provider about prostate cancer screening.  Testicular cancer screening is not recommended for adolescents or adult males who have no symptoms. Screening includes self-exam, a health care provider exam, and other screening tests. Consult with your health care provider about any symptoms you have or any concerns you have about testicular cancer.  Practice safe sex. Use condoms and avoid high-risk sexual practices to reduce the spread of sexually transmitted infections (STIs).  You should be screened for STIs, including gonorrhea and chlamydia if:  You are sexually active and are younger than 24 years.  You are older than 24 years, and your health care provider tells you that you are at risk for this type of infection.  Your sexual activity has changed since you were last screened, and you are at an increased risk for chlamydia or gonorrhea. Ask your health care provider if you are at risk.  If you are at risk of being infected with HIV, it is recommended that you take a prescription medicine daily to  prevent HIV infection. This is called pre-exposure prophylaxis (PrEP). You are considered at risk if:  You are a man who has sex with other men (MSM).  You are a heterosexual man who is sexually active with multiple partners.  You take drugs by injection.  You are sexually active with a partner who has HIV.  Talk with your health care provider about whether you are at high risk of being infected with HIV. If you choose to begin PrEP, you should first be tested for HIV. You should then be tested every 3 months for as long as you are taking PrEP.  Use sunscreen. Apply sunscreen liberally and repeatedly throughout the day. You should seek shade when your shadow is shorter than you. Protect yourself by wearing long sleeves, pants, a wide-brimmed hat, and sunglasses year round whenever you are outdoors.  Tell your health care provider of new moles or changes in moles, especially if there is a change in shape or color. Also, tell your health care provider if a mole is larger than the size of a pencil eraser.  A one-time screening for abdominal aortic aneurysm (AAA) and surgical repair of large AAAs by ultrasound is recommended for men aged 65-75 years who are current or former smokers.  Stay current with your vaccines (immunizations). This information is not intended to replace advice given to you by your health care provider. Make sure you discuss any questions you have with your health care provider. Document Released: 07/11/2007 Document Revised: 02/02/2014 Document Reviewed: 10/16/2014 Elsevier Interactive Patient Education  2017 [employer]

## 2016-01-10 NOTE — Assessment & Plan Note (Signed)
Stable: Followed by ID. Next visit should be around January 2018. - I gave patient a number of his ID physicians office and asked him to call to make an appointment sometime in January or February.

## 2016-01-14 ENCOUNTER — Encounter: Payer: Self-pay | Admitting: Family Medicine

## 2016-02-07 ENCOUNTER — Encounter: Payer: Self-pay | Admitting: Obstetrics and Gynecology

## 2016-02-07 ENCOUNTER — Ambulatory Visit (INDEPENDENT_AMBULATORY_CARE_PROVIDER_SITE_OTHER): Payer: BLUE CROSS/BLUE SHIELD | Admitting: Obstetrics and Gynecology

## 2016-02-07 VITALS — BP 100/64 | HR 77 | Temp 98.4°F | Ht 68.0 in | Wt 166.8 lb

## 2016-02-07 DIAGNOSIS — B349 Viral infection, unspecified: Secondary | ICD-10-CM

## 2016-02-07 NOTE — Progress Notes (Signed)
   Subjective:   Patient ID: Jonathon Miles, male    DOB: August 25, 1975, 41 y.o.   MRN: [id number]  Patient presents for Same Day Appointment  Chief Complaint  Patient presents with  . Cough  . Chills    HPI: # URI Patient states that last Friday son came home from school coughing and congested. Patient developed similar symptoms 3 days ago. Symptoms include coughing, congestion, runny nose, sore throat. Denies fevers, shortness of breath, nausea vomiting. Eating injection well. Denies any fevers. Has been taking TheraFlu every 4 hours to help symptoms. Did not receive a flu vaccine this year. Endorsing that he has been coughing so much that is starting to hurt. Everyone in the household has similar symptoms.  Review of Systems   See HPI for ROS.   History  Smoking Status  . Never Smoker  Smokeless Tobacco  . Not on file    Past medical history, surgical, family, and social history reviewed and updated in the EMR as appropriate.  Objective:  BP 100/64 (BP Location: Left Arm, Patient Position: Sitting, Cuff Size: Large)   Pulse 77   Temp 98.4 F (36.9 C) (Oral)   Ht 5\' 8"  (1.727 m)   Wt 166 lb 12.8 oz (75.7 kg)   SpO2 94%   BMI 25.36 kg/m  Vitals and nursing note reviewed  Physical Exam  Constitutional: He is well-developed, well-nourished, and in no distress.  HENT:  Nose: Nose normal.  Mouth/Throat: Oropharynx is clear and moist and mucous membranes are normal.  Eyes: Conjunctivae and EOM are normal. Pupils are equal, round, and reactive to light.  Neck: Normal range of motion. Neck supple.  Cardiovascular: Normal rate, regular rhythm and normal heart sounds.   Pulmonary/Chest: Effort normal and breath sounds normal. He has no wheezes. He has no rales.  Lymphadenopathy:    He has no cervical adenopathy.  Skin: Skin is warm and dry. No rash noted.    Assessment & Plan:  1. Viral illness Symptoms consistent with viral illness. Sick contact with similar symptoms.  Well-appearing and vitals stable. Conservative management at this time. Return precautions given. He should to return to clinic next week for flu vaccine.   PATIENT EDUCATION PROVIDED: See AVS   Caryl AdaJazma Lucius Wise, DO PGY-3, [facility]

## 2016-02-07 NOTE — Patient Instructions (Signed)
Please schedule nurse visit for Monday for family to get flu vaccines

## 2017-01-14 ENCOUNTER — Ambulatory Visit (INDEPENDENT_AMBULATORY_CARE_PROVIDER_SITE_OTHER): Payer: BLUE CROSS/BLUE SHIELD | Admitting: Internal Medicine

## 2017-01-14 ENCOUNTER — Encounter: Payer: Self-pay | Admitting: Internal Medicine

## 2017-01-14 VITALS — BP 100/60 | HR 70 | Temp 98.2°F | Wt 172.2 lb

## 2017-01-14 DIAGNOSIS — B181 Chronic viral hepatitis B without delta-agent: Secondary | ICD-10-CM | POA: Diagnosis not present

## 2017-01-14 DIAGNOSIS — L209 Atopic dermatitis, unspecified: Secondary | ICD-10-CM | POA: Diagnosis not present

## 2017-01-14 DIAGNOSIS — R7303 Prediabetes: Secondary | ICD-10-CM | POA: Diagnosis not present

## 2017-01-14 DIAGNOSIS — Z0000 Encounter for general adult medical examination without abnormal findings: Secondary | ICD-10-CM

## 2017-01-14 MED ORDER — HYDROCORTISONE 1 % EX LOTN: 1.0000 "application " | TOPICAL_LOTION | Freq: Two times a day (BID) | CUTANEOUS | 0 refills | Status: DC

## 2017-01-14 NOTE — Progress Notes (Signed)
   Jonathon Miles is a 41 y.o. male presents to office today for annual physical exam examination.  Concerns today include:  1. Atopic dermatitis  Onset: started about 1 year ago  Progression: has not worsened significantly, has stayed about the same. Patient has tried Ketconazole cream in the past without much improvement in symptoms. New medications/Topical agents:   Family members with rash: None      Healthcare Maintenance  Immunizations needed: None  Refills needed today: None   Health  Exercise: run twice a week Diet: Breakfast eggs and coffee, Lunch rice and beans, Dinner Yam, rice, meat  Smoking: None  Alcohol:None  Drugs: None  Mood: No depression or SI  Dentist: Has not seen dentist, has list of dentist    Past Medical History:  Diagnosis Date  . Hepatitis B carrier (HCC) 08/19/2015   Social History   Socioeconomic History  . Marital status: Single    Spouse name: Not on file  . Number of children: Not on file  . Years of education: Not on file  . Highest education level: Not on file  Social Needs  . Financial resource strain: Not on file  . Food insecurity - worry: Not on file  . Food insecurity - inability: Not on file  . Transportation needs - medical: Not on file  . Transportation needs - non-medical: Not on file  Occupational History  . Not on file  Tobacco Use  . Smoking status: Never Smoker  . Smokeless tobacco: Never Used  Substance and Sexual Activity  . Alcohol use: No  . Drug use: No  . Sexual activity: Not Currently    Partners: Female  Other Topics Concern  . Not on file  Social History Narrative   From [place]   Past Surgical History:  Procedure Laterality Date  . HERNIA REPAIR Left    No family history on file.  Patient reports no  vision/ hearing changes,anorexia, weight change, fever ,adenopathy, persistant / recurrent hoarseness, swallowing issues, chest pain, edema,persistant / recurrent cough, hemoptysis, dyspnea(rest,  exertional, paroxysmal nocturnal), gastrointestinal  bleeding (melena, rectal bleeding), abdominal pain, excessive heart burn, GU symptoms(dysuria, hematuria, pyuria, voiding/incontinence  Issues) syncope, focal weakness, severe memory loss, concerning skin lesions, depression, anxiety, abnormal bruising/bleeding, major joint swelling.    Physical exam Physical Exam  Constitutional: He is oriented to person, place, and time. He appears well-developed and well-nourished.  HENT:  Head: Normocephalic and atraumatic.  Eyes: Conjunctivae are normal. Pupils are equal, round, and reactive to light.  Neck: Normal range of motion. Neck supple.  Cardiovascular: Normal rate and regular rhythm.  Pulmonary/Chest: Effort normal and breath sounds normal.  Abdominal: Soft. Bowel sounds are normal.  Musculoskeletal: Normal range of motion.  Neurological: He is alert and oriented to person, place, and time.  Skin: Skin is warm.  Psychiatric: He has a normal mood and affect.    Assessment/ Plan: Patient for annual physical exam.   Prediabetes CBGs obtained, no symptoms of polyuria, polydipsia or vision changes   Hepatitis B infection Following with ID Next appointment in   Hepatitis B carrier [facility](HCC) Per patient ID states due to viral load being low, no need for treatment  ID follow up in January 2019  Obtain CMET and CBC    Healthcare maintenance Low risk male - Consider repeat lipid panel in 1 year    Aboubacar Matsuo Cathlean CowerMikell PGY-3, [facility]

## 2017-01-14 NOTE — Patient Instructions (Signed)
It was nice seeing you today. I am going to prescribe you steroid cream use this twice daily for 1-2 weeks. Please follow up with me if no improvement

## 2017-01-15 LAB — COMPREHENSIVE METABOLIC PANEL
ALBUMIN: 4.6 g/dL (ref 3.5–5.5)
ALT: 35 IU/L (ref 0–44)
AST: 30 IU/L (ref 0–40)
Albumin/Globulin Ratio: 1.7 (ref 1.2–2.2)
Alkaline Phosphatase: 79 IU/L (ref 39–117)
BUN / CREAT RATIO: 9 (ref 9–20)
BUN: 10 mg/dL (ref 6–24)
Bilirubin Total: 0.3 mg/dL (ref 0.0–1.2)
CO2: 26 mmol/L (ref 20–29)
Calcium: 9.7 mg/dL (ref 8.7–10.2)
Chloride: 101 mmol/L (ref 96–106)
Creatinine, Ser: 1.1 mg/dL (ref 0.76–1.27)
GFR calc Af Amer: 96 mL/min/{1.73_m2} (ref >59)
GFR, EST NON AFRICAN AMERICAN: 83 mL/min/{1.73_m2} (ref >59)
GLOBULIN, TOTAL: 2.7 g/dL (ref 1.5–4.5)
GLUCOSE: 88 mg/dL (ref 65–99)
Potassium: 3.9 mmol/L (ref 3.5–5.2)
Sodium: 141 mmol/L (ref 134–144)
Total Protein: 7.3 g/dL (ref 6.0–8.5)

## 2017-01-15 LAB — CBC
HEMATOCRIT: 47.7 % (ref 37.5–51.0)
HEMOGLOBIN: 16.1 g/dL (ref 13.0–17.7)
MCH: 26 pg — ABNORMAL LOW (ref 26.6–33.0)
MCHC: 33.8 g/dL (ref 31.5–35.7)
MCV: 77 fL — ABNORMAL LOW (ref 79–97)
Platelets: 165 10*3/uL (ref 150–379)
RBC: 6.2 x10E6/uL — ABNORMAL HIGH (ref 4.14–5.80)
RDW: 15.6 % — ABNORMAL HIGH (ref 12.3–15.4)
WBC: 4.6 10*3/uL (ref 3.4–10.8)

## 2017-01-19 DIAGNOSIS — L219 Seborrheic dermatitis, unspecified: Secondary | ICD-10-CM | POA: Insufficient documentation

## 2017-01-19 NOTE — Assessment & Plan Note (Signed)
CBGs obtained, no symptoms of polyuria, polydipsia or vision changes

## 2017-01-19 NOTE — Assessment & Plan Note (Signed)
Will treat for atopic dermatitis with emollient and 1% hydrocortisone Follow up if no improvement in 1 week

## 2017-01-19 NOTE — Assessment & Plan Note (Signed)
Low risk male - Consider repeat lipid panel in 1 year

## 2017-01-19 NOTE — Assessment & Plan Note (Addendum)
Per patient ID states due to viral load being low, no need for treatment  ID follow up in January 2019  Obtain CMET and CBC

## 2017-01-19 NOTE — Assessment & Plan Note (Signed)
Following with ID Next appointment in

## 2017-02-02 ENCOUNTER — Encounter: Payer: Self-pay | Admitting: Internal Medicine

## 2017-09-06 ENCOUNTER — Encounter: Payer: Self-pay | Admitting: Family Medicine

## 2017-09-06 ENCOUNTER — Ambulatory Visit (INDEPENDENT_AMBULATORY_CARE_PROVIDER_SITE_OTHER): Payer: BLUE CROSS/BLUE SHIELD | Admitting: Family Medicine

## 2017-09-06 VITALS — BP 122/70 | HR 63 | Temp 98.4°F | Ht 68.0 in | Wt 166.6 lb

## 2017-09-06 DIAGNOSIS — Z0000 Encounter for general adult medical examination without abnormal findings: Secondary | ICD-10-CM

## 2017-09-06 MED ORDER — TETANUS-DIPHTH-ACELL PERTUSSIS 5-2.5-18.5 LF-MCG/0.5 IM SUSP
0.5000 mL | Freq: Once | INTRAMUSCULAR | Status: AC
  Administered 2017-09-06: 0.5 mL via INTRAMUSCULAR

## 2017-09-06 NOTE — Patient Instructions (Addendum)
It was great to meet you today! Thank you for letting me participate in your care!  Today, we discussed your need for immunizations. I have drawn an MMRI titer today to prove that you are immune and had your vaccinations. I have given you a TDap vaccine today.  For your lip I recommend using over the counter Blistex. I have included a picture below. You can also use Vasoline as well.    Please let us know if you need anything else.  Be well, [name] [name], DO PGY-2, [facility]

## 2017-09-06 NOTE — Progress Notes (Signed)
     Subjective:  HPI: Jonathon Miles is a 42 y.o. presenting to clinic today to discuss the following:  Request for Labs to prove immunity Patient is starting classes at [employer] and needs to provide proof of MMR immunity. Patient immigrated to [place] in 2009 and does not have his shot records with him.  He currently has no complaints and no health concerns at this time. He denies any recent sickness, fever, chills, nausea, vomiting, or rashes.  Health Maintenance: none     ROS noted in HPI.   Past Medical, Surgical, Social, and Family History Reviewed & Updated per EMR.   Pertinent Historical Findings include:   Social History   Tobacco Use  Smoking Status Never Smoker  Smokeless Tobacco Never Used    Objective: BP 122/70   Pulse 63   Temp 98.4 F (36.9 C) (Oral)   Ht '5\' 8"'$  (1.727 m)   Wt 166 lb 9.6 oz (75.6 kg)   SpO2 97%   BMI 25.33 kg/m  Vitals and nursing notes reviewed  Physical Exam Gen: NAD HEENT: Normocephalic, atraumatic, EOMI CV: RRR, no murmurs, normal S1, S2 split Resp: CTAB, no wheezing, rales, or rhonchi, comfortable work of breathing MSK: Moves all four extremities Ext: no clubbing, cyanosis, or edema Skin: warm, dry, intact, no rashes  No results found for this or any previous visit (from the past 72 hour(s)).  Assessment/Plan:  Healthcare maintenance Drawing titers for MMR for patient to meet requirements for college attendance. Will leave a printed copy for patient to pick up in the appropriate folder behind the check in desk.   PATIENT EDUCATION PROVIDED: See AVS    Diagnosis and plan along with any newly prescribed medication(s) were discussed in detail with this patient today. The patient verbalized understanding and agreed with the plan. Patient advised if symptoms worsen return to clinic or ER.   Health Maintainance:   Orders Placed This Encounter  Procedures  . Measles/Mumps/Rubella Immunity    Meds ordered this encounter    Medications  . Tdap (BOOSTRIX) injection 0.5 mL     Harolyn Rutherford, DO 09/06/2017, 4:19 PM PGY-2 [place]

## 2017-09-08 LAB — MEASLES/MUMPS/RUBELLA IMMUNITY
MUMPS ABS, IGG: 57.4 AU/mL (ref >10.9)
RUBELLA: 3.35 {index} (ref >0.99)
RUBEOLA AB, IGG: 88.5 [AU]/ml (ref >29.9)

## 2017-09-10 ENCOUNTER — Telehealth: Payer: Self-pay | Admitting: Family Medicine

## 2017-09-10 NOTE — Telephone Encounter (Signed)
Patient would like to be called concerning lab results.  Jonathon Miles, Michelle R, CMA

## 2017-09-10 NOTE — Telephone Encounter (Signed)
Pt would like for Dr. Karen ChafeLockamy to call him asap concerning some questions and results from his last appointment. He needs these for his school.

## 2017-09-11 NOTE — Assessment & Plan Note (Signed)
Drawing titers for MMR for patient to meet requirements for college attendance. Will leave a printed copy for patient to pick up in the appropriate folder behind the check in desk.

## 2017-10-29 IMAGING — US US ABDOMEN COMPLETE
1 series · 14 of 25 positions shown · non-contrast
Comparison: None.

CLINICAL DATA: Chronic hepatitis-B

EXAM:
ABDOMEN ULTRASOUND COMPLETE

[Series 1: us abdomen complete · 0.20mm/px · 14 of 71 slices shown]
[im 1/71]
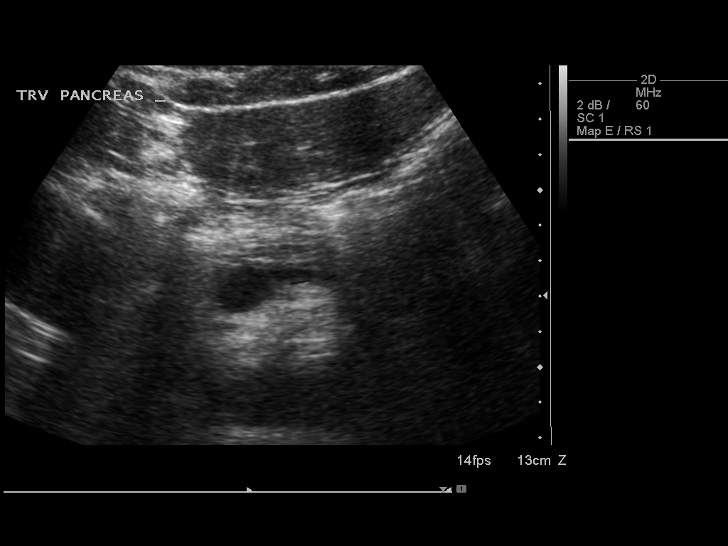
[im 6/71]
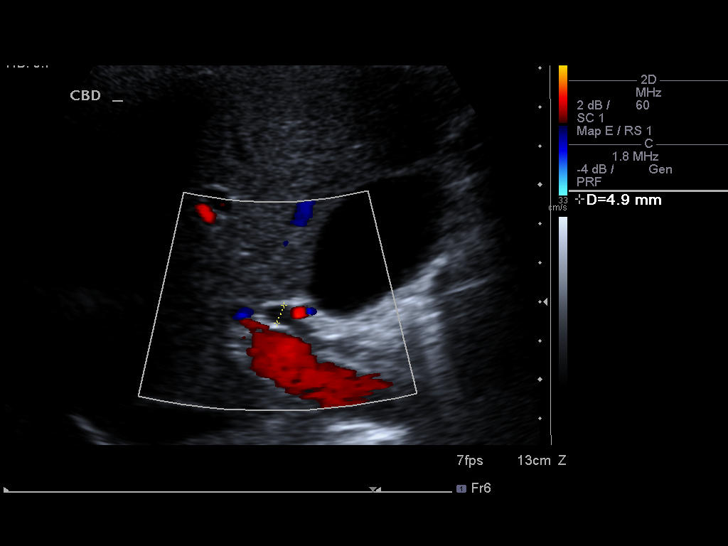
[im 12/71]
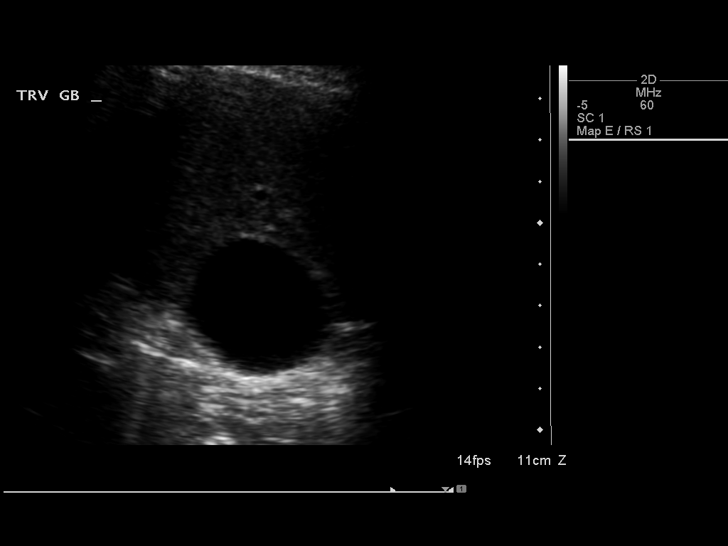
[im 18/71]
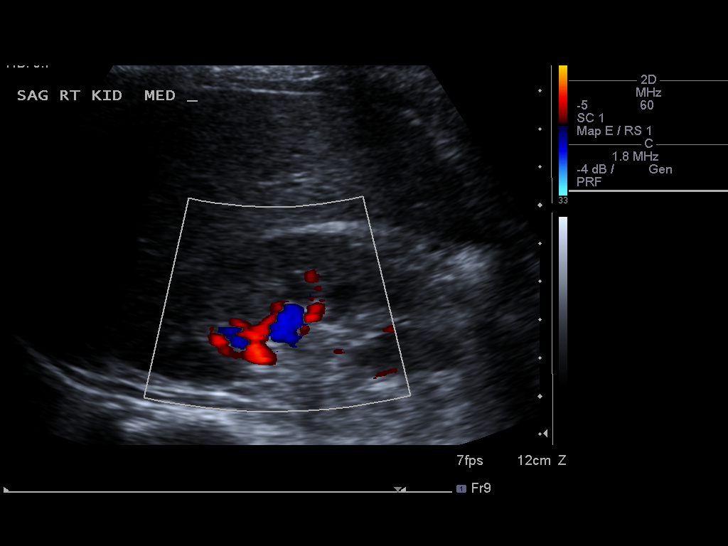
[im 24/71]
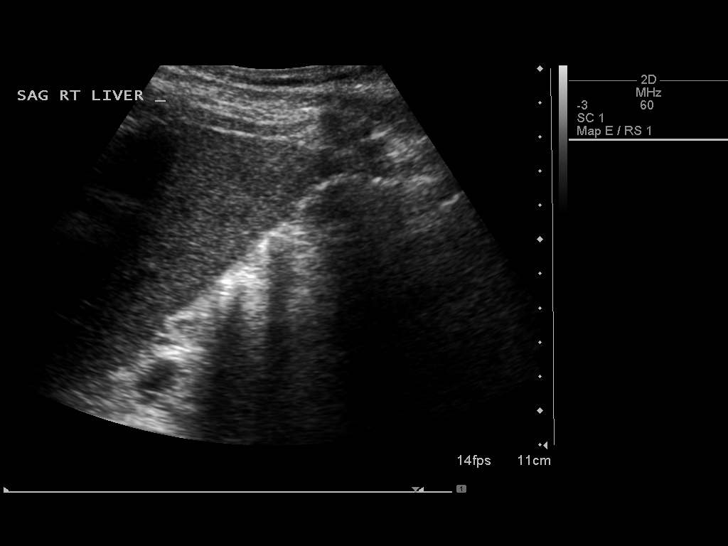
[im 27/71]
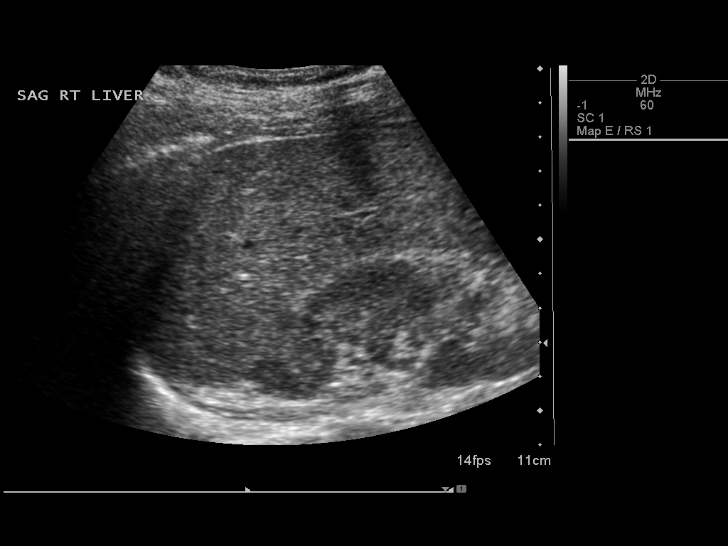
[im 33/71]
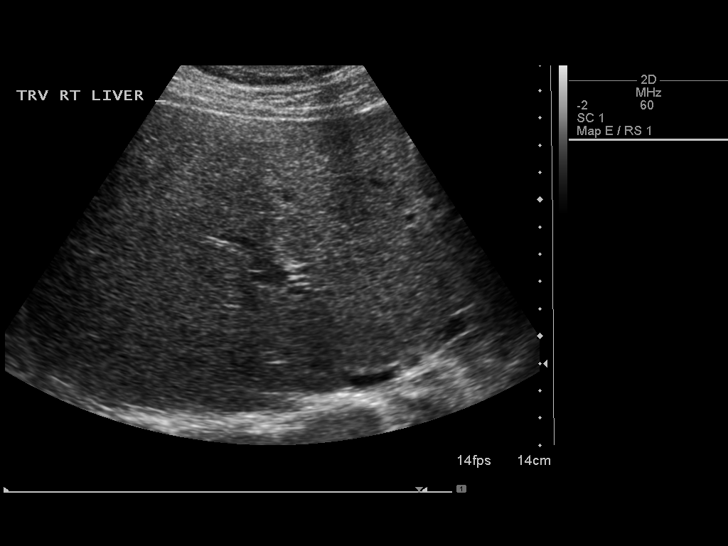
[im 38/71]
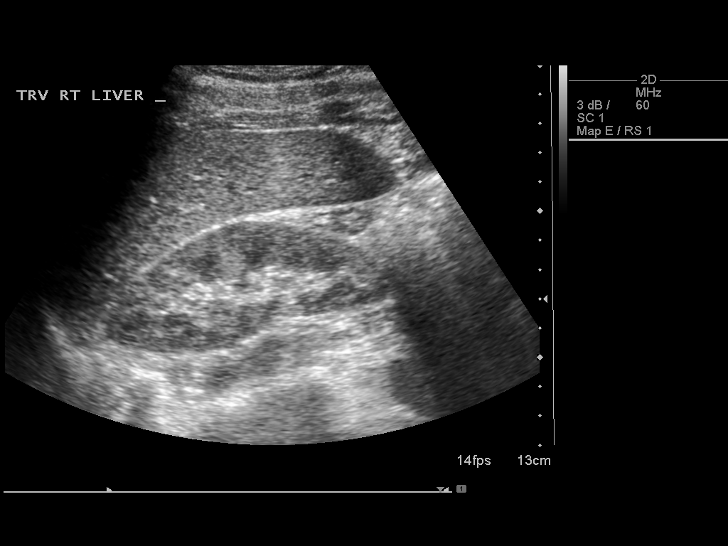
[im 44/71]
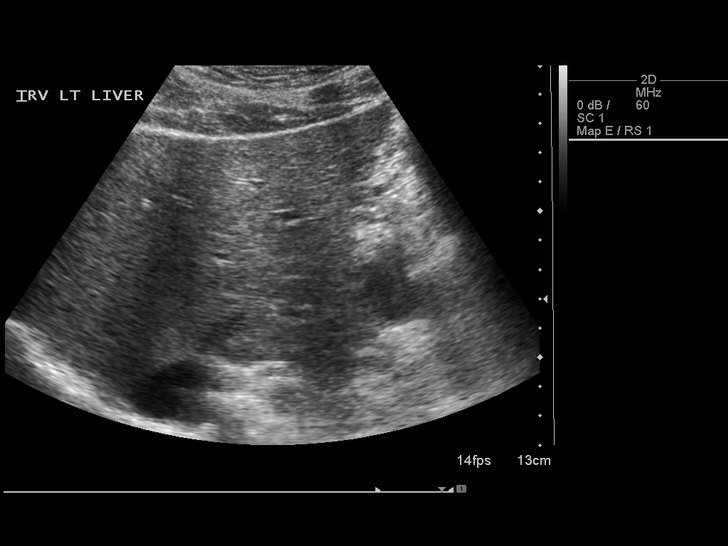
[im 47/71]
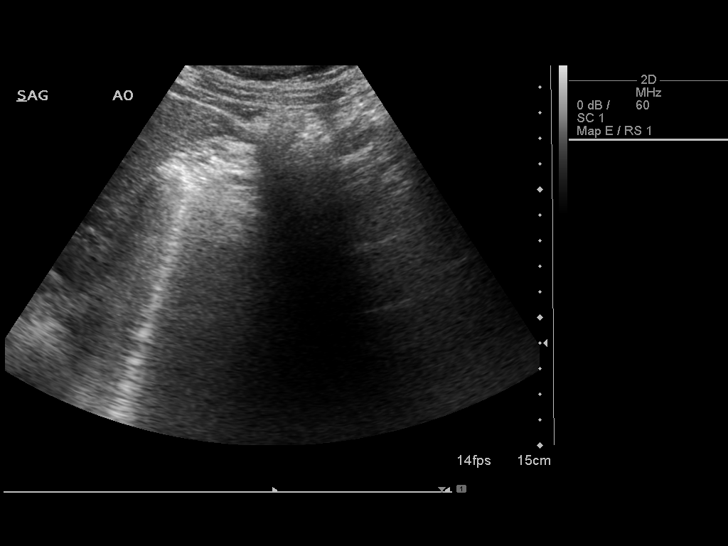
[im 53/71]
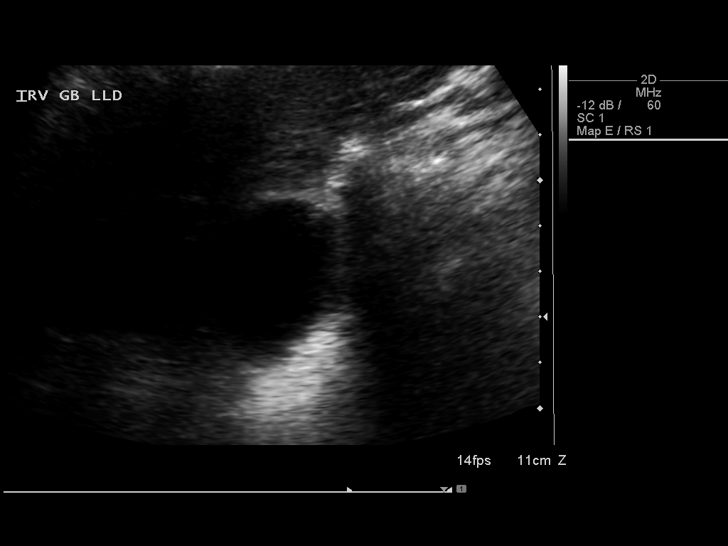
[im 59/71]
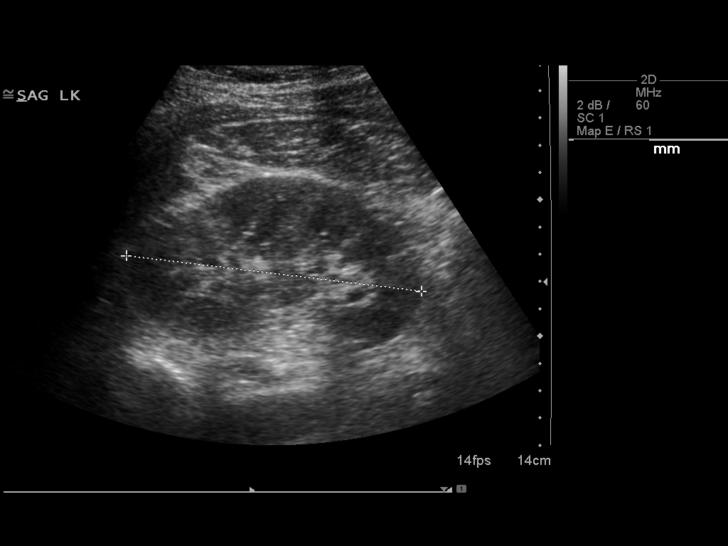
[im 65/71]
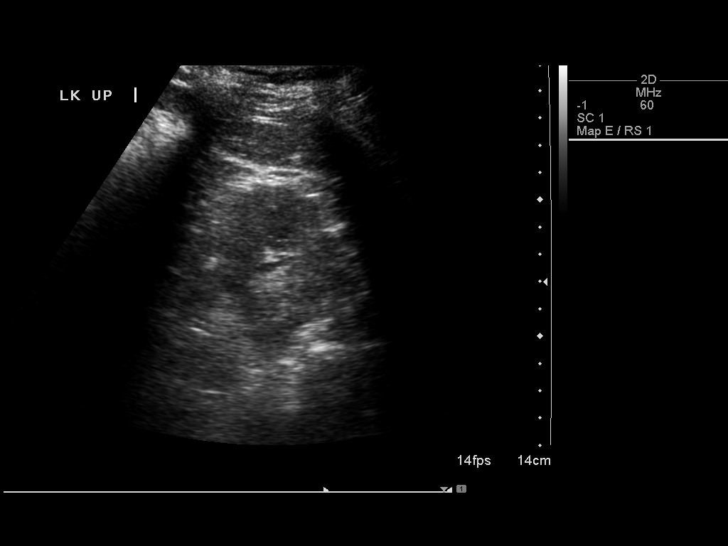
[im 71/71]
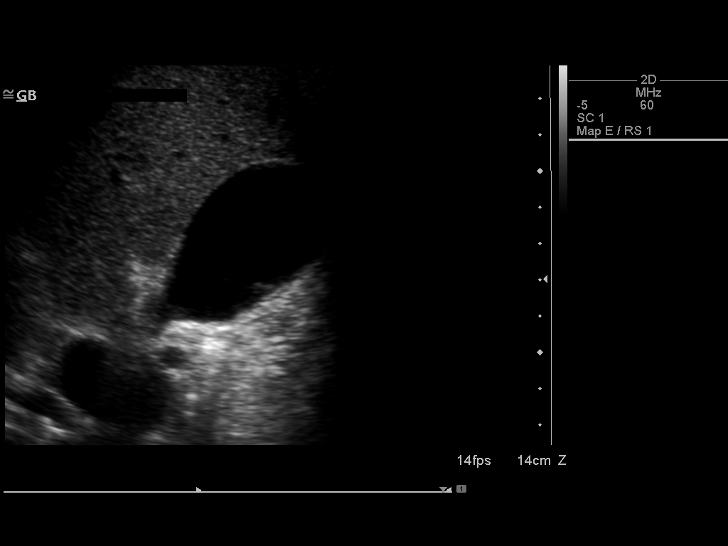

[14 of 25 positions shown; findings below may reference images not displayed]

FINDINGS: Gallbladder: No gallstones or wall thickening visualized. No
sonographic Murphy sign noted by sonographer.

Common bile duct: Diameter: 4.9 mm.

Liver: Not well visualized due to overlying bowel gas.

IVC: Not well visualized due to overlying bowel gas.

Pancreas: Visualized portion unremarkable.

Spleen: Size and appearance within normal limits.

Right Kidney: Length: 10.5 cm.. Echogenicity within normal limits.
No mass or hydronephrosis visualized.

Left Kidney: Length: 11.2 cm.. Echogenicity within normal limits. No
mass or hydronephrosis visualized.

Abdominal aorta: Not well visualized due to overlying bowel gas.

Other findings: None.
IMPRESSION: Somewhat limited exam by overlying bowel gas. No acute abnormality
is seen.

## 2018-01-14 ENCOUNTER — Other Ambulatory Visit: Payer: Self-pay

## 2018-01-14 ENCOUNTER — Encounter: Payer: Self-pay | Admitting: Family Medicine

## 2018-01-14 ENCOUNTER — Ambulatory Visit (INDEPENDENT_AMBULATORY_CARE_PROVIDER_SITE_OTHER): Payer: BLUE CROSS/BLUE SHIELD | Admitting: Family Medicine

## 2018-01-14 VITALS — BP 102/62 | HR 59 | Temp 98.2°F | Wt 165.8 lb

## 2018-01-14 DIAGNOSIS — B181 Chronic viral hepatitis B without delta-agent: Secondary | ICD-10-CM

## 2018-01-14 DIAGNOSIS — Z0000 Encounter for general adult medical examination without abnormal findings: Secondary | ICD-10-CM | POA: Diagnosis not present

## 2018-01-14 DIAGNOSIS — L219 Seborrheic dermatitis, unspecified: Secondary | ICD-10-CM

## 2018-01-14 MED ORDER — HYDROCORTISONE 2.5 % EX CREA: TOPICAL_CREAM | Freq: Two times a day (BID) | CUTANEOUS | 0 refills | Status: DC

## 2018-01-14 NOTE — Progress Notes (Signed)
Date of Visit: 01/14/2018   HPI:  Patient presents today for a well adult male exam.   Concerns today: rash on face, see below STD Screening: declines Exercise: runs and rides exercise bike Smoking: no Alcohol: no Drugs: no Dentist: has dentist Mood: no concerns  Has history of chronic hep B infection, has not followed up with ID, but is agreeable to doing so.  Dry skin on face - has rash on face that is quite dry chronically. Seen previously and given rx for low potency hydrocortisone but it didn't really help.  ROS: See HPI  PMFSH:  Cancers in family: none  PHYSICAL EXAM: BP 102/62   Pulse (!) 59   Temp 98.2 F (36.8 C) (Oral)   Wt 165 lb 12.8 oz (75.2 kg)   SpO2 99%   BMI 25.21 kg/m  Gen: NAD, pleasant, cooperative HEENT: NCAT, moist mucous membranes, no palpable thyromegaly or anterior cervical lymphadenopathy Heart: RRR, no murmurs Lungs: CTAB, NWOB Abdomen: soft, nontender to palpation Neuro: grossly nonfocal, speech normal Skin: dry flaky rash on face, no erythema or skin breakdown  ASSESSMENT/PLAN:  Health maintenance:  -STD screening: declines today -immunizations: UTD -lipid screening: UTD -handout given on health maintenance topics  Hepatitis B infection Refer back to ID as they had wanted him to follow up  Seborrheic dermatitis Rash on face likely seborrheic dermatitis Increase hydrocortisone potency to 2.5% Counseled on risks of thinning/lightening skin If not improving he will let me know and I will rx topical antifungal  FOLLOW UP: Follow up in 1 year for next physical Referring back to ID  GrenadaBrittany J. Pollie MeyerMcIntyre, MD [facility]

## 2018-01-14 NOTE — Patient Instructions (Signed)

## 2018-01-15 NOTE — Assessment & Plan Note (Signed)
Rash on face likely seborrheic dermatitis Increase hydrocortisone potency to 2.5% Counseled on risks of thinning/lightening skin If not improving he will let me know and I will rx topical antifungal

## 2018-01-15 NOTE — Assessment & Plan Note (Signed)
Refer back to ID as they had wanted him to follow up

## 2019-01-16 ENCOUNTER — Ambulatory Visit (INDEPENDENT_AMBULATORY_CARE_PROVIDER_SITE_OTHER): Payer: BC Managed Care – PPO | Admitting: Family Medicine

## 2019-01-16 ENCOUNTER — Other Ambulatory Visit: Payer: Self-pay

## 2019-01-16 VITALS — BP 105/60 | HR 58 | Wt 172.2 lb

## 2019-01-16 DIAGNOSIS — B181 Chronic viral hepatitis B without delta-agent: Secondary | ICD-10-CM | POA: Diagnosis not present

## 2019-01-16 NOTE — Patient Instructions (Signed)
Checking labs today Referring back to infectious disease team  Call with any questions  Be well, Dr. Ardelia Mems    Health Maintenance, Male Adopting a healthy lifestyle and getting preventive care are important in promoting health and wellness. Ask your health care provider about:  The right schedule for you to have regular tests and exams.  Things you can do on your own to prevent diseases and keep yourself healthy. What should I know about diet, weight, and exercise? Eat a healthy diet   Eat a diet that includes plenty of vegetables, fruits, low-fat dairy products, and lean protein.  Do not eat a lot of foods that are high in solid fats, added sugars, or sodium. Maintain a healthy weight Body mass index (BMI) is a measurement that can be used to identify possible weight problems. It estimates body fat based on height and weight. Your health care provider can help determine your BMI and help you achieve or maintain a healthy weight. Get regular exercise Get regular exercise. This is one of the most important things you can do for your health. Most adults should:  Exercise for at least 150 minutes each week. The exercise should increase your heart rate and make you sweat (moderate-intensity exercise).  Do strengthening exercises at least twice a week. This is in addition to the moderate-intensity exercise.  Spend less time sitting. Even light physical activity can be beneficial. Watch cholesterol and blood lipids Have your blood tested for lipids and cholesterol at 44 years of age, then have this test every 5 years. You may need to have your cholesterol levels checked more often if:  Your lipid or cholesterol levels are high.  You are older than 44 years of age.  You are at high risk for heart disease. What should I know about cancer screening? Many types of cancers can be detected early and may often be prevented. Depending on your health history and family history, you may  need to have cancer screening at various ages. This may include screening for:  Colorectal cancer.  Prostate cancer.  Skin cancer.  Lung cancer. What should I know about heart disease, diabetes, and high blood pressure? Blood pressure and heart disease  High blood pressure causes heart disease and increases the risk of stroke. This is more likely to develop in people who have high blood pressure readings, are of African descent, or are overweight.  Talk with your health care provider about your target blood pressure readings.  Have your blood pressure checked: ? Every 3-5 years if you are 57-60 years of age. ? Every year if you are 69 years old or older.  If you are between the ages of 9 and 26 and are a current or former smoker, ask your health care provider if you should have a one-time screening for abdominal aortic aneurysm (AAA). Diabetes Have regular diabetes screenings. This checks your fasting blood sugar level. Have the screening done:  Once every three years after age 101 if you are at a normal weight and have a low risk for diabetes.  More often and at a younger age if you are overweight or have a high risk for diabetes. What should I know about preventing infection? Hepatitis B If you have a higher risk for hepatitis B, you should be screened for this virus. Talk with your health care provider to find out if you are at risk for hepatitis B infection. Hepatitis C Blood testing is recommended for:  Everyone born from [birth year]  through 1966.  Anyone with known risk factors for hepatitis C. Sexually transmitted infections (STIs)  You should be screened each year for STIs, including gonorrhea and chlamydia, if: ? You are sexually active and are younger than 44 years of age. ? You are older than 44 years of age and your health care provider tells you that you are at risk for this type of infection. ? Your sexual activity has changed since you were last screened, and you are  at increased risk for chlamydia or gonorrhea. Ask your health care provider if you are at risk.  Ask your health care provider about whether you are at high risk for HIV. Your health care provider may recommend a prescription medicine to help prevent HIV infection. If you choose to take medicine to prevent HIV, you should first get tested for HIV. You should then be tested every 3 months for as long as you are taking the medicine. Follow these instructions at home: Lifestyle  Do not use any products that contain nicotine or tobacco, such as cigarettes, e-cigarettes, and chewing tobacco. If you need help quitting, ask your health care provider.  Do not use street drugs.  Do not share needles.  Ask your health care provider for help if you need support or information about quitting drugs. Alcohol use  Do not drink alcohol if your health care provider tells you not to drink.  If you drink alcohol: ? Limit how much you have to 0-2 drinks a day. ? Be aware of how much alcohol is in your drink. In the [place] one drink equals one 12 oz bottle of beer (355 mL), one 5 oz glass of wine (148 mL), or one 1 oz glass of hard liquor (44 mL). General instructions  Schedule regular health, dental, and eye exams.  Stay current with your vaccines.  Tell your health care provider if: ? You often feel depressed. ? You have ever been abused or do not feel safe at home. Summary  Adopting a healthy lifestyle and getting preventive care are important in promoting health and wellness.  Follow your health care provider's instructions about healthy diet, exercising, and getting tested or screened for diseases.  Follow your health care provider's instructions on monitoring your cholesterol and blood pressure. This information is not intended to replace advice given to you by your health care provider. Make sure you discuss any questions you have with your health care provider. Document Released: 07/11/2007  Document Revised: 01/05/2018 Document Reviewed: 01/05/2018 Elsevier Patient Education  2020 [employer].

## 2019-01-16 NOTE — Progress Notes (Signed)
Date of Visit: 01/16/2019   HPI:  Patient presents today for a well adult male exam.   Concerns today: occasional back pain in R lower back muscles. Has to do some heavy lifting with work. Took some aleve once which helped. Denies having fever, saddle anesthesia, lower extremity weakness, or problems with stooling or urination.   STD Screening: declines this today Exercise: exercises 3 times per week Smoking: no Alcohol: no Drugs: no Mood: no concerns  Chronic hep B - due for follow up with ID. Wants referral. Would like to have labs checked today  ROS: See HPI  [place]:  Cancers in family: none  PHYSICAL EXAM: BP 105/60   Pulse (!) 58   Wt 172 lb 3.2 oz (78.1 kg)   SpO2 99%   BMI 26.18 kg/m  Gen: NAD, pleasant, cooperative HEENT: NCAT, PERRL, no palpable thyromegaly or anterior cervical lymphadenopathy Heart: RRR, no murmurs Lungs: CTAB, NWOB Abdomen: soft, nontender to palpation Neuro: grossly nonfocal, speech normal Extremities: No appreciable lower extremity edema bilaterally, 2+ reflexes bilatearlly  ASSESSMENT/PLAN:  Health maintenance:  -STD screening: declines -immunizations: current -lipid screening: not indicated -colonoscopy: not indicated -handout given on health maintenance topics  Hepatitis B carrier ([place]) Check CMET and HBV DNA today  Refer back to ID  Back pain - no red flags. Musculoskeletal in nature, self limited. Use nsaid's as needed.   FOLLOW UP: Follow up in 1 year for next physical Referring to ID  [place] J. Ardelia Mems, [place]

## 2019-01-18 ENCOUNTER — Encounter: Payer: Self-pay | Admitting: Family Medicine

## 2019-01-18 LAB — CMP14+EGFR
ALT: 23 IU/L (ref 0–44)
AST: 27 IU/L (ref 0–40)
Albumin/Globulin Ratio: 1.5 (ref 1.2–2.2)
Albumin: 4.1 g/dL (ref 4.0–5.0)
Alkaline Phosphatase: 86 IU/L (ref 39–117)
BUN/Creatinine Ratio: 10 (ref 9–20)
BUN: 12 mg/dL (ref 6–24)
Bilirubin Total: 0.3 mg/dL (ref 0.0–1.2)
CO2: 25 mmol/L (ref 20–29)
Calcium: 9.4 mg/dL (ref 8.7–10.2)
Chloride: 103 mmol/L (ref 96–106)
Creatinine, Ser: 1.19 mg/dL (ref 0.76–1.27)
GFR calc Af Amer: 86 mL/min/{1.73_m2} (ref >59)
GFR calc non Af Amer: 74 mL/min/{1.73_m2} (ref >59)
Globulin, Total: 2.7 g/dL (ref 1.5–4.5)
Glucose: 87 mg/dL (ref 65–99)
Potassium: 4.3 mmol/L (ref 3.5–5.2)
Sodium: 140 mmol/L (ref 134–144)
Total Protein: 6.8 g/dL (ref 6.0–8.5)

## 2019-01-18 LAB — HEPATITIS B DNA, ULTRAQUANTITATIVE, PCR: HBV DNA SERPL PCR-ACNC: <10 IU/mL

## 2019-01-18 NOTE — Assessment & Plan Note (Signed)
Check CMET and HBV DNA today  Refer back to ID

## 2019-04-17 ENCOUNTER — Ambulatory Visit (INDEPENDENT_AMBULATORY_CARE_PROVIDER_SITE_OTHER): Payer: BC Managed Care – PPO | Admitting: Family Medicine

## 2019-04-17 ENCOUNTER — Other Ambulatory Visit: Payer: Self-pay

## 2019-04-17 VITALS — BP 110/68 | HR 64 | Ht 68.0 in | Wt 163.1 lb

## 2019-04-17 DIAGNOSIS — R1013 Epigastric pain: Secondary | ICD-10-CM | POA: Insufficient documentation

## 2019-04-17 MED ORDER — PANTOPRAZOLE SODIUM 40 MG PO TBEC: 40.0000 mg | DELAYED_RELEASE_TABLET | Freq: Every day | ORAL | 0 refills | Status: DC

## 2019-04-17 NOTE — Assessment & Plan Note (Signed)
Patient with 1 week history of epigastric pain that is made worse by eating.  History concerning for possible gastric ulcer given that food worsens symptoms.  Patient with no history of NSAIDs.  No unintentional weight loss.  Patient with no reported blood in his stool or dark stools and no hematemesis.  Patient symptoms not consistent with gallbladder etiology but will not rule out. -CBC, CMP, lipase -Will obtain urea breath test to rule out H. pylori infection prior to initiating PPI -We will start PPI after breath test performed -Patient to return if symptoms do not improve or worsen within the next couple weeks or sooner if he has blood in stool.

## 2019-04-17 NOTE — Patient Instructions (Signed)
Thank you for coming to see me today. It was a pleasure! Today we talked about:   We are getting some labs today to check your liver, pancreas and blood count.  We have done a test in order to see if you have the overgrowth of the bacteria we discussed called H. pylori.  I have sent you a medication that is called pantoprazole which is a PPI that should help with any acid in your stomach.  If you develop any blood in your stool or vomiting then do not hesitate to return to the office or be seen by a physician.  Please follow-up within 4 weeks if your symptoms do not improve or as needed.  If you have any questions or concerns, please do not hesitate to call the office at [phone].  Take Care,   [place] K[place]ra Beachem, DO

## 2019-04-17 NOTE — Progress Notes (Signed)
   SUBJECTIVE:   CHIEF COMPLAINT / HPI:   Abdominal pain: Patient reports that he has had abdominal pain since last week.  States that it hurts mainly in the epigastric portion of his stomach.  He states that it has increasingly gotten worse over the week and he is now having pain all over his abdomen.  Patient states that he feels that this eating makes his pain worse.  States he has not eaten since Saturday due to this pain.  He has tried Pepto-Bismol and Pepcid which did not help the pain.  He denies any nausea, vomiting, diarrhea.  He states his last bowel movement was yesterday and it was not dark and had no blood in it.  Patient denies any trouble swallowing, sore throat or change in voice.  He states he has had an issue with heartburn in the past and Pepcid worked but this feels different.  He states he is taking ibuprofen once for the pain which helped.  Other than that he does not take ibuprofen or other NSAIDs.  He denies drinking alcohol.  PERTINENT  PMH / PSH: h/o hepatitis C, prediabetes  OBJECTIVE:  BP 110/68   Pulse 64   Ht 5\' 8"  (1.727 m)   Wt 163 lb 2 oz (74 kg)   SpO2 99%   BMI 24.80 kg/m   General: NAD, pleasant Neck: Supple, no LAD Respiratory: normal work of breathing Gastrointestinal: soft, nontender, nondistended, normoactive BS, negative Murphy's Psych: AOx3, appropriate affect  ASSESSMENT/PLAN:   Epigastric pain Patient with 1 week history of epigastric pain that is made worse by eating.  History concerning for possible gastric ulcer given that food worsens symptoms.  Patient with no history of NSAIDs.  No unintentional weight loss.  Patient with no reported blood in his stool or dark stools and no hematemesis.  Patient symptoms not consistent with gallbladder etiology but will not rule out. -CBC, CMP, lipase -Will obtain urea breath test to rule out H. pylori infection prior to initiating PPI -We will start PPI after breath test performed -Patient to return if  symptoms do not improve or worsen within the next couple weeks or sooner if he has blood in stool.    Haeli Gerlich, DO PGY-3, [place] [facility]

## 2019-04-18 LAB — COMPREHENSIVE METABOLIC PANEL
ALT: 23 IU/L (ref 0–44)
AST: 23 IU/L (ref 0–40)
Albumin/Globulin Ratio: 1.6 (ref 1.2–2.2)
Albumin: 4.2 g/dL (ref 4.0–5.0)
Alkaline Phosphatase: 131 IU/L — ABNORMAL HIGH (ref 39–117)
BUN/Creatinine Ratio: 8 — ABNORMAL LOW (ref 9–20)
BUN: 9 mg/dL (ref 6–24)
Bilirubin Total: 0.6 mg/dL (ref 0.0–1.2)
CO2: 26 mmol/L (ref 20–29)
Calcium: 9.7 mg/dL (ref 8.7–10.2)
Chloride: 100 mmol/L (ref 96–106)
Creatinine, Ser: 1.15 mg/dL (ref 0.76–1.27)
GFR calc Af Amer: 89 mL/min/{1.73_m2} (ref >59)
GFR calc non Af Amer: 77 mL/min/{1.73_m2} (ref >59)
Globulin, Total: 2.7 g/dL (ref 1.5–4.5)
Glucose: 84 mg/dL (ref 65–99)
Potassium: 4.2 mmol/L (ref 3.5–5.2)
Sodium: 140 mmol/L (ref 134–144)
Total Protein: 6.9 g/dL (ref 6.0–8.5)

## 2019-04-18 LAB — CBC
Hematocrit: 43.6 % (ref 37.5–51.0)
Hemoglobin: 14 g/dL (ref 13.0–17.7)
MCH: 25 pg — ABNORMAL LOW (ref 26.6–33.0)
MCHC: 32.1 g/dL (ref 31.5–35.7)
MCV: 78 fL — ABNORMAL LOW (ref 79–97)
Platelets: 196 10*3/uL (ref 150–450)
RBC: 5.6 x10E6/uL (ref 4.14–5.80)
RDW: 14.3 % (ref 11.6–15.4)
WBC: 4.1 10*3/uL (ref 3.4–10.8)

## 2019-04-18 LAB — LIPASE: Lipase: 26 U/L (ref 13–78)

## 2019-04-20 LAB — H. PYLORI BREATH TEST

## 2019-04-21 ENCOUNTER — Telehealth: Payer: Self-pay | Admitting: Family Medicine

## 2019-04-21 NOTE — Telephone Encounter (Signed)
Attempted to call patient with results from office visit on 3/22.  Left a voicemail for patient informing him of mostly normal results. H. pylori test was canceled by send out company.  Patient was told to initiate PPI after performing test here in the office. If patient has started medication that he should continue for 2 weeks. If his symptoms resolve it is likely that he does not need further work-up. However explained to patient that if after 2 weeks he is still experiencing symptoms, he should return to the office. May need further evaluation for H. pylori at that time but will need to be off of PPI for at least 1-2 weeks prior to collecting repeat breath test or H. Pylori stool test.   [place] Johnella Crumm, DO PGY-3, [facility]

## 2019-04-24 LAB — H. PYLORI BREATH TEST

## 2019-08-04 ENCOUNTER — Encounter: Payer: Self-pay | Admitting: Family Medicine

## 2019-08-04 ENCOUNTER — Ambulatory Visit (INDEPENDENT_AMBULATORY_CARE_PROVIDER_SITE_OTHER): Payer: BC Managed Care – PPO | Admitting: Family Medicine

## 2019-08-04 ENCOUNTER — Other Ambulatory Visit: Payer: Self-pay

## 2019-08-04 DIAGNOSIS — K5900 Constipation, unspecified: Secondary | ICD-10-CM | POA: Insufficient documentation

## 2019-08-04 MED ORDER — POLYETHYLENE GLYCOL 3350 17 GM/SCOOP PO POWD: 17.0000 g | Freq: Every day | ORAL | 1 refills | Status: DC | PRN

## 2019-08-04 NOTE — Patient Instructions (Signed)
It was good to see you today.  Thank you for coming in.  I think you have Constipation.      You should be better in 1 week.  I am prescribing you Miralax powder to be taken twice a day as needed.      If you do not feel better in a week, please come back to see [place].  If pain starts to feel worse, develop severe nausea and vomiting, have blood in stool, or have fever come back and see [place] or go to ED.   Be Well, Jovita Kussmaul MD

## 2019-08-04 NOTE — Progress Notes (Signed)
° ° °  SUBJECTIVE:   CHIEF COMPLAINT / HPI: Abdominal Pain  Jonathon Miles is a 44 yo male with abdominal pain.  Indicates the pain as diffuse and started 6 days ago.  Describes the pain as bloating and like he has "gas".  Indicates the pain is diffuse throughout abdomen and does not Radiate anywhere.  Denies any n/v/d.  Has had decreased appetite over this time period and pain increases following eating.  Has still been drinking fluids fine and has been eating small portions.  Pain did decrease after having bowel movement.  Indicates stool appeared normal and did not have blood or melena.  Indicates had "stomach pain" previously in March for which he was prescribed Pantoprazole, which resolved pain.   PERTINENT  PMH / PSH: None  OBJECTIVE:   BP 98/62    Pulse 78    Wt 165 lb 12.8 oz (75.2 kg)    SpO2 97%    BMI 25.21 kg/m    Physical Exam Constitutional:      General: He is not in acute distress. HENT:     Head: Normocephalic.  Cardiovascular:     Rate and Rhythm: Normal rate and regular rhythm.     Heart sounds: Normal heart sounds.  Pulmonary:     Effort: Pulmonary effort is normal.     Breath sounds: Normal breath sounds.  Abdominal:     General: Abdomen is flat. Bowel sounds are increased. There is no distension.     Palpations: Abdomen is soft.     Tenderness: There is abdominal tenderness in the right upper quadrant, right lower quadrant, left upper quadrant and left lower quadrant. Negative signs include Murphy's sign, Rovsing's sign, McBurney's sign and obturator sign.     Hernia: No hernia is present.  Neurological:     Mental Status: He is alert.     ASSESSMENT/PLAN:   Constipation Functional constipation likely source of pain given history and partial resolution with bowel movement.  Less likely GERD given diffuse pain and less likely Appendicitis, Cholecystitis, or diverticulitis given H&P. - Prescribed Miralax qd PRN for constipation, indicated patient could also  use for future episodes - Insturcted patient to return in 1 week if pain does not resolve or sooner if pain becomes worse, or other concerning symptoms such as fever, blood in stool - If does not resolve will obtain possible imaging/blood tests given patients previous anemia/labs     Jovita Kussmaul, MD [facility]

## 2019-08-06 NOTE — Assessment & Plan Note (Signed)
Functional constipation likely source of pain given history and partial resolution with bowel movement.  Less likely GERD given diffuse pain and less likely Appendicitis, Cholecystitis, or diverticulitis given H&P. - Prescribed Miralax qd PRN for constipation, indicated patient could also use for future episodes - Insturcted patient to return in 1 week if pain does not resolve or sooner if pain becomes worse, or other concerning symptoms such as fever, blood in stool - If does not resolve will obtain possible imaging/blood tests given patients previous anemia/labs

## 2020-01-16 NOTE — Progress Notes (Signed)
    SUBJECTIVE:   CHIEF COMPLAINT / HPI: physical   Back pain Patient reports that sometimes after heavy lifting at work he has back pain.  He has been try to massage the area and has not been taking regular medications for this.  He denies any urinary or fecal incontinence.  He denies any numbness or weakness in his lower extremities.  Patient denies any recent fevers or chills associated with the back pain.  He reports that this pain usually resolves with rest but request to have a medication for days when pain is increased.  Hepatitis B carrier Patient states that he was evaluated by infectious disease for this last year and was told that his viral load was extremely low and they had no further treatment at the time.  Patient denies any right upper quadrant pain.   HM   He reports having both vaccines of pfizer on Sept 1st and in august.  Patient states that he has already received his influenza vaccine for this season.   PERTINENT  PMH / PSH:  Hepatitis B carrier  OBJECTIVE:   BP 108/64   Pulse 83   Ht 5\' 8"  (1.727 m)   Wt 178 lb 3.2 oz (80.8 kg)   SpO2 98%   BMI 27.10 kg/m   General: male appearing stated age in no acute distress Cardio: Normal S1 and S2, no S3 or S4. Rhythm is regular room air. No murmurs or rubs.  Bilateral radial pulses palpable Pulm: Clear to auscultation bilaterally, no crackles, wheezing, or diminished breath sounds. Normal respiratory effort, stable on RA Abdomen: Bowel sounds normal. Abdomen soft and non-tender. Extremities: No peripheral edema. Warm/ well perfused. Neuro: pt alert and oriented x4   ASSESSMENT/PLAN:   Healthcare maintenance Up-to-date on both doses of Covid vaccine with last dose in September. Up-to-date on influenza vaccine for this season  Prediabetes We will check hemoglobin A1c today  Hepatitis B carrier (HCC) We will complete hepatitis B blood work CMP CBC   F/u in 1 year or earlier PRN  October,  MD [facility]

## 2020-01-17 ENCOUNTER — Other Ambulatory Visit: Payer: Self-pay

## 2020-01-17 ENCOUNTER — Encounter: Payer: Self-pay | Admitting: Family Medicine

## 2020-01-17 ENCOUNTER — Ambulatory Visit (INDEPENDENT_AMBULATORY_CARE_PROVIDER_SITE_OTHER): Payer: BC Managed Care – PPO | Admitting: Family Medicine

## 2020-01-17 VITALS — BP 108/64 | HR 83 | Ht 68.0 in | Wt 178.2 lb

## 2020-01-17 DIAGNOSIS — B181 Chronic viral hepatitis B without delta-agent: Secondary | ICD-10-CM

## 2020-01-17 DIAGNOSIS — R7303 Prediabetes: Secondary | ICD-10-CM | POA: Diagnosis not present

## 2020-01-17 DIAGNOSIS — Z0000 Encounter for general adult medical examination without abnormal findings: Secondary | ICD-10-CM | POA: Diagnosis not present

## 2020-01-17 DIAGNOSIS — G8929 Other chronic pain: Secondary | ICD-10-CM | POA: Diagnosis not present

## 2020-01-17 DIAGNOSIS — M545 Low back pain, unspecified: Secondary | ICD-10-CM

## 2020-01-17 LAB — POCT GLYCOSYLATED HEMOGLOBIN (HGB A1C): Hemoglobin A1C: 6.1 % — AB (ref 4.0–5.6)

## 2020-01-17 MED ORDER — IBUPROFEN 600 MG PO TABS: 600.0000 mg | ORAL_TABLET | Freq: Three times a day (TID) | ORAL | 2 refills | Status: DC | PRN

## 2020-01-17 NOTE — Assessment & Plan Note (Signed)
We will check hemoglobin A1c today

## 2020-01-17 NOTE — Patient Instructions (Addendum)
It was a pleasure to see you today!  Thank you for choosing [facility] for your primary care.  Jonathon Miles was seen for annual physical.   Our plans for today were:  Today we have completed blood work to check your electrolytes and glucose in viral load for your hepatitis history.  I will notify you of any abnormal results and if we need to send you back to infectious disease.  Great job on being caught up on all of your health maintenance items!   You should return to our clinic in 1 year for annual physical.   Best Wishes,   Dr. Neita Garnet

## 2020-01-17 NOTE — Assessment & Plan Note (Signed)
We will complete hepatitis B blood work CMP CBC

## 2020-01-17 NOTE — Assessment & Plan Note (Signed)
Up-to-date on both doses of Covid vaccine with last dose in September. Up-to-date on influenza vaccine for this season

## 2020-01-19 LAB — CBC WITH DIFFERENTIAL/PLATELET
Basophils Absolute: 0 10*3/uL (ref 0.0–0.2)
Basos: 1 %
EOS (ABSOLUTE): 0.1 10*3/uL (ref 0.0–0.4)
Eos: 3 %
Hematocrit: 43.8 % (ref 37.5–51.0)
Hemoglobin: 14.3 g/dL (ref 13.0–17.7)
Immature Grans (Abs): 0 10*3/uL (ref 0.0–0.1)
Immature Granulocytes: 0 %
Lymphocytes Absolute: 1.8 10*3/uL (ref 0.7–3.1)
Lymphs: 47 %
MCH: 25.1 pg — ABNORMAL LOW (ref 26.6–33.0)
MCHC: 32.6 g/dL (ref 31.5–35.7)
MCV: 77 fL — ABNORMAL LOW (ref 79–97)
Monocytes Absolute: 0.4 10*3/uL (ref 0.1–0.9)
Monocytes: 9 %
Neutrophils Absolute: 1.6 10*3/uL (ref 1.4–7.0)
Neutrophils: 40 %
Platelets: 121 10*3/uL — ABNORMAL LOW (ref 150–450)
RBC: 5.69 x10E6/uL (ref 4.14–5.80)
RDW: 14.8 % (ref 11.6–15.4)
WBC: 4 10*3/uL (ref 3.4–10.8)

## 2020-01-19 LAB — COMPREHENSIVE METABOLIC PANEL
ALT: 25 IU/L (ref 0–44)
AST: 23 IU/L (ref 0–40)
Albumin/Globulin Ratio: 1.5 (ref 1.2–2.2)
Albumin: 4.3 g/dL (ref 4.0–5.0)
Alkaline Phosphatase: 104 IU/L (ref 44–121)
BUN/Creatinine Ratio: 13 (ref 9–20)
BUN: 16 mg/dL (ref 6–24)
Bilirubin Total: 0.2 mg/dL (ref 0.0–1.2)
CO2: 21 mmol/L (ref 20–29)
Calcium: 9.6 mg/dL (ref 8.7–10.2)
Chloride: 107 mmol/L — ABNORMAL HIGH (ref 96–106)
Creatinine, Ser: 1.2 mg/dL (ref 0.76–1.27)
GFR calc Af Amer: 85 mL/min/{1.73_m2} (ref >59)
GFR calc non Af Amer: 73 mL/min/{1.73_m2} (ref >59)
Globulin, Total: 2.9 g/dL (ref 1.5–4.5)
Glucose: 85 mg/dL (ref 65–99)
Potassium: 3.9 mmol/L (ref 3.5–5.2)
Sodium: 142 mmol/L (ref 134–144)
Total Protein: 7.2 g/dL (ref 6.0–8.5)

## 2020-01-19 LAB — HEPATITIS B DNA, ULTRAQUANTITATIVE, PCR
HBV DNA SERPL PCR-ACNC: 70 IU/mL
HBV DNA SERPL PCR-LOG IU: 1.845 log10 IU/mL

## 2020-01-22 ENCOUNTER — Other Ambulatory Visit: Payer: Self-pay | Admitting: Family Medicine

## 2020-01-22 DIAGNOSIS — B181 Chronic viral hepatitis B without delta-agent: Secondary | ICD-10-CM

## 2020-01-22 NOTE — Progress Notes (Signed)
Referral for increased Hep B quant level of 70 from <10 one year ago. Notified patient of referral

## 2021-01-22 ENCOUNTER — Other Ambulatory Visit: Payer: Self-pay

## 2021-01-22 ENCOUNTER — Ambulatory Visit (INDEPENDENT_AMBULATORY_CARE_PROVIDER_SITE_OTHER): Payer: BC Managed Care – PPO | Admitting: Student

## 2021-01-22 ENCOUNTER — Encounter: Payer: Self-pay | Admitting: Student

## 2021-01-22 ENCOUNTER — Telehealth: Payer: Self-pay

## 2021-01-22 VITALS — BP 115/73 | HR 79 | Ht 68.0 in | Wt 176.1 lb

## 2021-01-22 DIAGNOSIS — R7303 Prediabetes: Secondary | ICD-10-CM | POA: Diagnosis not present

## 2021-01-22 DIAGNOSIS — Z0000 Encounter for general adult medical examination without abnormal findings: Secondary | ICD-10-CM

## 2021-01-22 DIAGNOSIS — B181 Chronic viral hepatitis B without delta-agent: Secondary | ICD-10-CM

## 2021-01-22 NOTE — Patient Instructions (Signed)
It was great to see you! Thank you for allowing me to participate in your care!  Continue doing a great job staying healthy and active!  Please follow-up with our infectious disease doctor like we discussed. This is important to keep you healthy.  We are checking some labs today, I will call you if they are abnormal will send you a MyChart message or a letter if they are normal.  If you do not hear about your labs in the next 2 weeks please let us know.  Take care and seek immediate care sooner if you develop any concerns.   Dr. Darral Dash [facility]

## 2021-01-22 NOTE — Assessment & Plan Note (Signed)
Last A1c 6.1% in 12/2019. Will get Hgb A1c today. Discussed importance of physical activity and healthy eating habits.

## 2021-01-22 NOTE — Progress Notes (Signed)
° ° °  SUBJECTIVE:   CHIEF COMPLAINT / HPI:   Jonathon Miles is a 45 year old male here for annual physical. 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   No complaints today. Social history reviewed-  Non-smoker, non-drinker. Medications reviewed- None.  Hepatitis B Carrier Has yet to follow-up with infectious disease, although he had elevation in viral load last year. No right upper quadrant pain.  Healthcare maintenance UTD on influenza vaccine. Will get COVID booster through his work.  PERTINENT  PMH / PSH: Hepatitis B Carrier, Pre-diabetes  OBJECTIVE:   BP 115/73    Pulse 79    Ht 5\' 8"  (1.727 m)    Wt 176 lb 2 oz (79.9 kg)    SpO2 96%    BMI 26.78 kg/m   General: Well-appearing male, pleasant, appears stated age HEENT: Good dentition. No pharyngeal or tonsillar erythema. MMM.  CV: Normal rate and rhythm, no murmurs Resp: Lungs clear in all fields to auscultation. No wheezing, crackles. Normal work of breathing on room air Abdomen: Soft, non-distended, non-tender Ext: Warm, dry, well-perfused. No edema Neuro: Speech clear and fluent. No focal neurologic deficits   ASSESSMENT/PLAN:   Healthcare maintenance UTD on yearly influenza vaccination. Will get COVID booster through work.  Lipid panel collected today. Last lipid panel wnl in 2017.  Prediabetes Last A1c 6.1% in 12/2019. Will get Hgb A1c today. Discussed importance of physical activity and healthy eating habits.  Hepatitis B carrier (HCC) I called [facility] and provided patient's phone number to schedule appointment. Discussed importance of this with the patient, which he understood. Collected CBC, CMP,  Hepatitis A antibody, and hepatitis B DNA PCR. Will follow up on these results. Placed order for RUQ ultrasound.   01/2020, DO [facility]

## 2021-01-22 NOTE — Telephone Encounter (Signed)
Attempted to contact patient for scheduling appointment. Unable to leave voice message as patient voicemail box is currently full.  Sending patient a Mychart message to contact our office for scheduling.  Jonathon Miles

## 2021-01-22 NOTE — Assessment & Plan Note (Signed)
I called [facility] and provided patient's phone number to schedule appointment. Discussed importance of this with the patient, which he understood. Collected CBC, CMP,  Hepatitis A antibody, and hepatitis B DNA PCR. Will follow up on these results. Placed order for RUQ ultrasound.

## 2021-01-22 NOTE — Assessment & Plan Note (Addendum)
UTD on yearly influenza vaccination. Will get COVID booster through work.  Lipid panel collected today. Last lipid panel wnl in 2017.

## 2021-01-23 LAB — CBC
Hematocrit: 42 % (ref 37.5–51.0)
Hemoglobin: 13.6 g/dL (ref 13.0–17.7)
MCH: 25.2 pg — ABNORMAL LOW (ref 26.6–33.0)
MCHC: 32.4 g/dL (ref 31.5–35.7)
MCV: 78 fL — ABNORMAL LOW (ref 79–97)
Platelets: 116 10*3/uL — ABNORMAL LOW (ref 150–450)
RBC: 5.39 x10E6/uL (ref 4.14–5.80)
RDW: 14.4 % (ref 11.6–15.4)
WBC: 3.7 10*3/uL (ref 3.4–10.8)

## 2021-01-23 LAB — HEPATITIS B DNA, ULTRAQUANTITATIVE, PCR
HBV DNA SERPL PCR-ACNC: 50 IU/mL
HBV DNA SERPL PCR-LOG IU: 1.699 log10 IU/mL

## 2021-01-23 LAB — COMPREHENSIVE METABOLIC PANEL
ALT: 24 IU/L (ref 0–44)
AST: 26 IU/L (ref 0–40)
Albumin/Globulin Ratio: 1.8 (ref 1.2–2.2)
Albumin: 4.3 g/dL (ref 4.0–5.0)
Alkaline Phosphatase: 99 IU/L (ref 44–121)
BUN/Creatinine Ratio: 14 (ref 9–20)
BUN: 15 mg/dL (ref 6–24)
Bilirubin Total: 0.3 mg/dL (ref 0.0–1.2)
CO2: 24 mmol/L (ref 20–29)
Calcium: 9.3 mg/dL (ref 8.7–10.2)
Chloride: 103 mmol/L (ref 96–106)
Creatinine, Ser: 1.09 mg/dL (ref 0.76–1.27)
Globulin, Total: 2.4 g/dL (ref 1.5–4.5)
Glucose: 111 mg/dL — ABNORMAL HIGH (ref 70–99)
Potassium: 4 mmol/L (ref 3.5–5.2)
Sodium: 142 mmol/L (ref 134–144)
Total Protein: 6.7 g/dL (ref 6.0–8.5)
eGFR: 85 mL/min/{1.73_m2} (ref >59)

## 2021-01-23 LAB — HEMOGLOBIN A1C
Est. average glucose Bld gHb Est-mCnc: 131 mg/dL
Hgb A1c MFr Bld: 6.2 % — ABNORMAL HIGH (ref 4.8–5.6)

## 2021-01-23 LAB — LIPID PANEL
Chol/HDL Ratio: 3 ratio (ref 0.0–5.0)
Cholesterol, Total: 155 mg/dL (ref 100–199)
HDL: 52 mg/dL (ref >39)
LDL Chol Calc (NIH): 87 mg/dL (ref 0–99)
Triglycerides: 85 mg/dL (ref 0–149)
VLDL Cholesterol Cal: 16 mg/dL (ref 5–40)

## 2021-01-23 LAB — HEPATITIS A ANTIBODY, TOTAL: hep A Total Ab: POSITIVE — AB

## 2021-01-24 ENCOUNTER — Telehealth: Payer: Self-pay | Admitting: Student

## 2021-01-24 NOTE — Telephone Encounter (Signed)
Patient to follow up in the office next week. Thanks

## 2021-01-24 NOTE — Telephone Encounter (Signed)
Attempted to call patient to schedule appointment for thrombocytopenia workup. No answer. VM box full. Will try again later.

## 2021-01-28 ENCOUNTER — Other Ambulatory Visit (HOSPITAL_COMMUNITY): Payer: Self-pay

## 2021-01-29 ENCOUNTER — Ambulatory Visit (INDEPENDENT_AMBULATORY_CARE_PROVIDER_SITE_OTHER): Payer: BC Managed Care – PPO | Admitting: Infectious Diseases

## 2021-01-29 ENCOUNTER — Other Ambulatory Visit: Payer: Self-pay

## 2021-01-29 ENCOUNTER — Ambulatory Visit: Payer: BC Managed Care – PPO | Admitting: Infectious Diseases

## 2021-01-29 ENCOUNTER — Encounter: Payer: Self-pay | Admitting: Infectious Diseases

## 2021-01-29 VITALS — BP 156/93 | HR 70 | Temp 97.7°F | Wt 176.0 lb

## 2021-01-29 DIAGNOSIS — B181 Chronic viral hepatitis B without delta-agent: Secondary | ICD-10-CM

## 2021-01-29 DIAGNOSIS — D696 Thrombocytopenia, unspecified: Secondary | ICD-10-CM

## 2021-01-29 DIAGNOSIS — Z7185 Encounter for immunization safety counseling: Secondary | ICD-10-CM | POA: Insufficient documentation

## 2021-01-29 DIAGNOSIS — Z129 Encounter for screening for malignant neoplasm, site unspecified: Secondary | ICD-10-CM | POA: Diagnosis not present

## 2021-01-29 NOTE — Progress Notes (Addendum)
[facility]                                      [address], [place], [place], [address]                                               Phn. [phone]; Fax: [phone]                                                               Date: 01/29/2021  Reason for Visit: Hepatitis B Initial Visit    HPI: Jonathon Miles is a 46 y.o.old male with PMH of Hepatitis B who is referred from Primary care provider for evaluation and management of Hepatitis B.   He knew about his hep B positive status for the first time in 2017 during a wellness exam by PCP. He was then seen by my partner Dr Tommy Medal where labs did nit indicate need for tx. US abdomen was also unremarkable for chronic liver disease. He was supposed to follow up in 6 months but then he was lost to follow up. His children and wife were tested for Hepatitis B at that time which was negative and they were later vaccinated for Hepatitis B.   He was born in [place] and moved to the [place] in 2007. He currently stays with his wife and 2 children ( 14 and 16 years). He works at [employer] in [employer].   Denies previous history of blood transfusions, sharing of toothbrushes/razors, or sexual contact with known positive partners.  Denies personal or family history of liver disease/Liver cancer. He has not received treatment to date.  Tells me has received 2 doses of COVID vaccine but has not received booster. Also says he has received Flu vaccine.   ROS: Denies yellowish discoloration of sclera and skin, abdominal pain/distension, hematemesis.            Denies fever, chills, nightsweats, nausea, vomiting, diarrhea, constipation, weight loss, recent hospitalizations, rashes, joint complaints, shortness of breath, chest pain, headaches, dysuria .  Medications - none  No Known Allergies  PMH- none  PSH- hernia surgery   Social History   Socioeconomic History   Marital  status: Single    Spouse name: Not on file   Number of children: Not on file   Years of education: Not on file   Highest education level: Not on file  Occupational History   Not on file  Tobacco Use   Smoking status: Never   Smokeless tobacco: Never  Substance and Sexual Activity   Alcohol use: No   Drug use: No   Sexual activity: Not Currently    Partners: Female  Other Topics Concern   Not on file  Social History Narrative   From [place]   Social Determinants of Health   Financial Resource Strain: Not on file  Food Insecurity: Not on file  Transportation Needs: Not on file  Physical Activity: Not on file  Stress: Not on file  Social Connections: Not on file  Intimate Partner Violence: Not on file   Vitals  BP (!) 156/93    Pulse 70    Temp 97.7 F (36.5 C) (Oral)    Wt 176 lb (79.8 kg)    SpO2 99%    BMI 26.76 kg/m   Gen: Alert and oriented x 3, no acute distress HEENT: [place]/AT, no scleral icterus, no pale conjunctivae, hearing normal, oral mucosa moist Neck: Supple Cardio: Regular rate and rhythm; +S1 and S2 Resp: CTAB GI: Soft, nontender, nondistended, bowel sounds present GU: Musc: Extremities: No cyanosis, clubbing, or edema Skin: No rashes, lesions, or ecchymoses Neuro: No focal deficits Psych: Calm, cooperative   Laboratory  CBC Latest Ref Rng & Units 01/22/2021 01/17/2020 04/17/2019  WBC 3.4 - 10.8 x10E3/uL 3.7 4.0 4.1  Hemoglobin 13.0 - 17.7 g/dL 13.6 14.3 14.0  Hematocrit 37.5 - 51.0 % 42.0 43.8 43.6  Platelets 150 - 450 x10E3/uL 116(L) 121(L) 196   CMP Latest Ref Rng & Units 01/22/2021 01/17/2020 04/17/2019  Glucose 70 - 99 mg/dL 111(H) 85 84  BUN 6 - 24 mg/dL 15 16 9   Creatinine 0.76 - 1.27 mg/dL 1.09 1.20 1.15  Sodium 134 - 144 mmol/L 142 142 140  Potassium 3.5 - 5.2 mmol/L 4.0 3.9 4.2  Chloride 96 - 106 mmol/L 103 107(H) 100  CO2 20 - 29 mmol/L 24 21 26   Calcium 8.7 - 10.2 mg/dL 9.3 9.6 9.7  Total Protein 6.0 - 8.5 g/dL 6.7 7.2 6.9   Total Bilirubin 0.0 - 1.2 mg/dL 0.3 0.2 0.6  Alkaline Phos 44 - 121 IU/L 99 104 131(H)  AST 0 - 40 IU/L 26 23 23   ALT 0 - 44 IU/L 24 25 23     US abdomen 08/26/2015 Pertinent Imaging FINDINGS: Gallbladder: No gallstones or wall thickening visualized. No sonographic Murphy sign noted by sonographer. Common bile duct: Diameter: 4.9 mm. Liver: Not well visualized due to overlying bowel gas. IVC: Not well visualized due to overlying bowel gas. Pancreas: Visualized portion unremarkable. Spleen: Size and appearance within normal limits. Right Kidney: Length: 10.5 cm. Echogenicity within normal limits. No mass or hydronephrosis visualized. Left Kidney: Length: 11.2 cm. Echogenicity within normal limits. No mass or hydronephrosis visualized. Abdominal aorta: Not well visualized due to overlying bowel gas. Other findings: None. IMPRESSION: Somewhat limited exam by overlying bowel gas. No acute abnormality is seen.  Problem List Items Addressed This Visit       Digestive   Hepatitis B infection - Primary   Relevant Orders   Hepatic function panel   Hepatitis B core antibody, total   Hepatitis B e antibody   Hepatitis B e antigen   Hepatitis B surface antibody,qualitative   Hepatitis B surface antigen   US ABDOMEN COMPLETE W/ELASTOGRAPHY     Hematopoietic and Hemostatic   Thrombocytopenia ([place])     Other   Immunization counseling   Cancer screening    Assessment/Plan: Chronic Hepatitis B, Likely perinatal acquisition  Discussed the pathogenesis, transmission, prevention, risks of left untreated, monitoring  and treatment options for hepatitis C Labs today for monitoring status of Hep B and check if tx indicated   Orders Placed This Encounter  Procedures   US ABDOMEN COMPLETE W/ELASTOGRAPHY   Hepatic function panel   Hepatitis B core antibody, total   Hepatitis B e antibody   Hepatitis B e antigen   Hepatitis B surface antibody,qualitative   Hepatitis B surface  antigen   Hepatitis delta antibody   HIV Antibody (routine testing w rflx)  Hepatitis C antibody    Liver ca screening  US abdomen as above  Thrombocytopenia No symptoms and signs of bleeding In the setting of Hep B, monitor   Immunization Counseling  Counseled for COVID booster Immune to hep A  Preventive measures  Vaccination for household and sexual contacts if not already immune Use barrier protection during sexual intercourse if partner is not vaccinated or is not naturally immune No sharing of toothbrushes or razors or nail clippers No sharing of injection equipment/glucose testing equipment Cover open cuts and scratches Clean blood spills with bleach solution  I have personally spent 60 minutes involved in face-to-face and non-face-to-face activities for this patient on the day of the visit. Professional time spent includes the following activities: Preparing to see the patient (review of tests), Obtaining and/or reviewing separately obtained history (admission/discharge record), Performing a medically appropriate examination and/or evaluation , Ordering medications/tests/procedures, referring and communicating with other health care professionals, Documenting clinical information in the EMR, Independently interpreting results (not separately reported), Communicating results to the patient/family/caregiver, Counseling and educating the patient/family/caregiver and Care coordination (not separately reported).   Patients questions were addressed and answered.   Electronically signed by:  Rosiland Oz, MD Infectious Diseases  Office phone [phone] Fax no. [phone]

## 2021-01-29 NOTE — Addendum Note (Signed)
Addended by: Odette Fraction on: 01/29/2021 11:58 AM   Modules accepted: Orders

## 2021-01-30 ENCOUNTER — Telehealth: Payer: Self-pay | Admitting: Student

## 2021-02-01 LAB — HEPATITIS B CORE ANTIBODY, TOTAL: Hep B Core Total Ab: REACTIVE — AB

## 2021-02-01 LAB — HEPATIC FUNCTION PANEL
AG Ratio: 1.5 (calc) (ref 1.0–2.5)
ALT: 21 U/L (ref 9–46)
AST: 21 U/L (ref 10–40)
Albumin: 4.3 g/dL (ref 3.6–5.1)
Alkaline phosphatase (APISO): 89 U/L (ref 36–130)
Bilirubin, Direct: 0.1 mg/dL (ref 0.0–0.2)
Globulin: 2.9 g/dL (calc) (ref 1.9–3.7)
Indirect Bilirubin: 0.4 mg/dL (calc) (ref 0.2–1.2)
Total Bilirubin: 0.5 mg/dL (ref 0.2–1.2)
Total Protein: 7.2 g/dL (ref 6.1–8.1)

## 2021-02-01 LAB — HEPATITIS B E ANTIGEN: Hep B E Ag: NONREACTIVE

## 2021-02-01 LAB — HEPATITIS B E ANTIBODY: Hep B E Ab: REACTIVE — AB

## 2021-02-01 LAB — HEPATITIS B SURFACE ANTIBODY,QUALITATIVE: Hep B S Ab: NONREACTIVE

## 2021-02-01 LAB — HEPATITIS C ANTIBODY
Hepatitis C Ab: NONREACTIVE
SIGNAL TO CUT-OFF: 0.15 (ref <1.00)

## 2021-02-01 LAB — HEPATITIS DELTA ANTIBODY: Hepatitis D Ab, Total: NEGATIVE

## 2021-02-01 LAB — HEPATITIS B SURFACE ANTIGEN: Hepatitis B Surface Ag: REACTIVE — AB

## 2021-02-01 LAB — HIV ANTIBODY (ROUTINE TESTING W REFLEX): HIV 1&2 Ab, 4th Generation: NONREACTIVE

## 2021-02-03 ENCOUNTER — Ambulatory Visit (HOSPITAL_COMMUNITY): Payer: BC Managed Care – PPO

## 2021-02-04 NOTE — Telephone Encounter (Signed)
Erroneous encounter

## 2021-02-11 ENCOUNTER — Telehealth: Payer: Self-pay

## 2021-02-11 NOTE — Telephone Encounter (Signed)
-----   Message from Rosiland Oz, MD sent at 02/11/2021  3:34 PM EST ----- Could you please arrange a fu with me to discuss his lab results ? Virtual appointment is OK.

## 2021-02-13 NOTE — Telephone Encounter (Signed)
Patient scheduled for a phone visit on 02/14/21. Jonathon Miles

## 2021-02-14 ENCOUNTER — Encounter: Payer: BC Managed Care – PPO | Admitting: Infectious Diseases

## 2021-02-14 ENCOUNTER — Other Ambulatory Visit: Payer: Self-pay

## 2021-02-19 ENCOUNTER — Encounter: Payer: BC Managed Care – PPO | Admitting: Infectious Diseases

## 2021-02-19 ENCOUNTER — Other Ambulatory Visit: Payer: Self-pay

## 2021-03-28 ENCOUNTER — Telehealth: Payer: Self-pay

## 2021-03-28 NOTE — Telephone Encounter (Signed)
Called patient to schedule follow up virtual visit to discuss labs, no answer and voicemail box full.  ? ?Sandie Ano, RN ? ?

## 2021-03-31 NOTE — Progress Notes (Deleted)
No show

## 2021-07-01 ENCOUNTER — Encounter: Payer: Self-pay | Admitting: *Deleted

## 2022-01-21 ENCOUNTER — Encounter: Payer: BC Managed Care – PPO | Admitting: Family Medicine

## 2022-01-23 ENCOUNTER — Ambulatory Visit (INDEPENDENT_AMBULATORY_CARE_PROVIDER_SITE_OTHER): Payer: BC Managed Care – PPO | Admitting: Family Medicine

## 2022-01-23 ENCOUNTER — Encounter: Payer: Self-pay | Admitting: Family Medicine

## 2022-01-23 VITALS — BP 124/81 | HR 73 | Wt 170.1 lb

## 2022-01-23 DIAGNOSIS — R7303 Prediabetes: Secondary | ICD-10-CM

## 2022-01-23 DIAGNOSIS — Z0000 Encounter for general adult medical examination without abnormal findings: Secondary | ICD-10-CM

## 2022-01-23 DIAGNOSIS — D696 Thrombocytopenia, unspecified: Secondary | ICD-10-CM | POA: Diagnosis not present

## 2022-01-23 NOTE — Patient Instructions (Addendum)
It was nice seeing you today!  Make sure to get in with the infectious disease specialist when you can.  Think about getting a colonoscopy for colon cancer screening. Give [place] a call if you want a referral for this.  Keep up the good work with exercise and healthy eating habits!  Stay well, Jonathon Deeds, MD [facility] [phone]  --  Make sure to check out at the front desk before you leave today.  Please arrive at least 15 minutes prior to your scheduled appointments.  If you had blood work today, I will send you a MyChart message or a letter if results are normal. Otherwise, I will give you a call.  If you had a referral placed, they will call you to set up an appointment. Please give [place] a call if you don't hear back in the next 2 weeks.  If you need additional refills before your next appointment, please call your pharmacy first.

## 2022-01-23 NOTE — Progress Notes (Cosign Needed)
    SUBJECTIVE:   CHIEF COMPLAINT / HPI:  Chief Complaint  Patient presents with   Annual Exam    No concerns today.  He was noted to have prediabetes elevated A1c last year and so he has been trying to increase his exercise and cut out added sugars in his diet.  He goes running 60 minutes/week. Denies smoking, alcohol, drug use. He lives with his wife and 2 children at home.  Regarding his hepatitis B he was seen by infectious disease earlier this year but did not make it to his follow-up appointments.  He reports that he was in the process of changing insurances so he did not go through with the follow-up appointments.  He will have new insurance at the start of the new year and a few days and will plan to get in with infectious disease at that time.  PERTINENT  PMH / PSH: Hepatitis B  Patient Care Team: Latrelle Dodrill, MD as PCP - General (Family Medicine)   OBJECTIVE:   BP 124/81   Pulse 73   Wt 170 lb 2 oz (77.2 kg)   SpO2 98%   BMI 25.87 kg/m   Physical Exam Constitutional:      General: He is not in acute distress. Cardiovascular:     Rate and Rhythm: Normal rate and regular rhythm.  Pulmonary:     Effort: Pulmonary effort is normal. No respiratory distress.     Breath sounds: Normal breath sounds.  Neurological:     Mental Status: He is alert.         01/23/2022    4:08 PM  Depression screen PHQ 2/9  Decreased Interest 0  Down, Depressed, Hopeless 0  PHQ - 2 Score 0  Altered sleeping 1  Tired, decreased energy 0  Change in appetite 0  Feeling bad or failure about yourself  0  Trouble concentrating 0  Moving slowly or fidgety/restless 0  Suicidal thoughts 0  PHQ-9 Score 1     {Show previous vital signs (optional):23777}    ASSESSMENT/PLAN:   1. Encounter for routine history and physical examination of adult  2. Thrombocytopenia (HCC) Mild, noted on recent CBC 1 year ago so we will repeat today - CBC  3. Prediabetes He is working on  lifestyle changes we will repeat today - Hemoglobin A1c    HCM - colonoscopy discussed, patient will consider and let us know if he would like a referral - flu vaccine previously obtained at pharmacy - Covid vaccine previously obtained at pharmacy  No follow-ups on file.   Littie Deeds, MD [facility]

## 2022-01-24 LAB — CBC
Hematocrit: 46.4 % (ref 37.5–51.0)
Hemoglobin: 14.8 g/dL (ref 13.0–17.7)
MCH: 25.6 pg — ABNORMAL LOW (ref 26.6–33.0)
MCHC: 31.9 g/dL (ref 31.5–35.7)
MCV: 80 fL (ref 79–97)
Platelets: 123 10*3/uL — ABNORMAL LOW (ref 150–450)
RBC: 5.78 x10E6/uL (ref 4.14–5.80)
RDW: 14.7 % (ref 11.6–15.4)
WBC: 4.5 10*3/uL (ref 3.4–10.8)

## 2022-01-24 LAB — HEMOGLOBIN A1C
Est. average glucose Bld gHb Est-mCnc: 140 mg/dL
Hgb A1c MFr Bld: 6.5 % — ABNORMAL HIGH (ref 4.8–5.6)

## 2022-02-02 ENCOUNTER — Encounter: Payer: Self-pay | Admitting: Family Medicine

## 2022-06-12 NOTE — Progress Notes (Signed)
This encounter was created in error - please disregard.

## 2022-06-12 NOTE — Progress Notes (Signed)
This encounter was created in error - please disregard.  This encounter was created in error - please disregard.

## 2023-01-25 ENCOUNTER — Ambulatory Visit (INDEPENDENT_AMBULATORY_CARE_PROVIDER_SITE_OTHER): Payer: BC Managed Care – PPO | Admitting: Family Medicine

## 2023-01-25 VITALS — BP 122/87 | HR 88 | Ht 68.0 in | Wt 173.4 lb

## 2023-01-25 DIAGNOSIS — M545 Low back pain, unspecified: Secondary | ICD-10-CM

## 2023-01-25 DIAGNOSIS — Z1211 Encounter for screening for malignant neoplasm of colon: Secondary | ICD-10-CM

## 2023-01-25 DIAGNOSIS — Z0000 Encounter for general adult medical examination without abnormal findings: Secondary | ICD-10-CM

## 2023-01-25 DIAGNOSIS — R7303 Prediabetes: Secondary | ICD-10-CM | POA: Diagnosis not present

## 2023-01-25 DIAGNOSIS — B181 Chronic viral hepatitis B without delta-agent: Secondary | ICD-10-CM | POA: Diagnosis not present

## 2023-01-25 DIAGNOSIS — D696 Thrombocytopenia, unspecified: Secondary | ICD-10-CM

## 2023-01-25 LAB — POCT GLYCOSYLATED HEMOGLOBIN (HGB A1C): HbA1c, POC (prediabetic range): 6.2 % (ref 5.7–6.4)

## 2023-01-25 NOTE — Patient Instructions (Signed)
Health Maintenance, Male Adopting a healthy lifestyle and getting preventive care are important in promoting health and wellness. Ask your health care provider about: The right schedule for you to have regular tests and exams. Things you can do on your own to prevent diseases and keep yourself healthy. What should I know about diet, weight, and exercise? Eat a healthy diet  Eat a diet that includes plenty of vegetables, fruits, low-fat dairy products, and lean protein. Do not eat a lot of foods that are high in solid fats, added sugars, or sodium. Maintain a healthy weight Body mass index (BMI) is a measurement that can be used to identify possible weight problems. It estimates body fat based on height and weight. Your health care provider can help determine your BMI and help you achieve or maintain a healthy weight. Get regular exercise Get regular exercise. This is one of the most important things you can do for your health. Most adults should: Exercise for at least 150 minutes each week. The exercise should increase your heart rate and make you sweat (moderate-intensity exercise). Do strengthening exercises at least twice a week. This is in addition to the moderate-intensity exercise. Spend less time sitting. Even light physical activity can be beneficial. Watch cholesterol and blood lipids Have your blood tested for lipids and cholesterol at 48 years of age, then have this test every 5 years. You may need to have your cholesterol levels checked more often if: Your lipid or cholesterol levels are high. You are older than 48 years of age. You are at high risk for heart disease. What should I know about cancer screening? Many types of cancers can be detected early and may often be prevented. Depending on your health history and family history, you may need to have cancer screening at various ages. This may include screening for: Colorectal cancer. Prostate cancer. Skin cancer. Lung  cancer. What should I know about heart disease, diabetes, and high blood pressure? Blood pressure and heart disease High blood pressure causes heart disease and increases the risk of stroke. This is more likely to develop in people who have high blood pressure readings or are overweight. Talk with your health care provider about your target blood pressure readings. Have your blood pressure checked: Every 3-5 years if you are 18-39 years of age. Every year if you are 40 years old or older. If you are between the ages of 65 and 75 and are a current or former smoker, ask your health care provider if you should have a one-time screening for abdominal aortic aneurysm (AAA). Diabetes Have regular diabetes screenings. This checks your fasting blood sugar level. Have the screening done: Once every three years after age 45 if you are at a normal weight and have a low risk for diabetes. More often and at a younger age if you are overweight or have a high risk for diabetes. What should I know about preventing infection? Hepatitis B If you have a higher risk for hepatitis B, you should be screened for this virus. Talk with your health care provider to find out if you are at risk for hepatitis B infection. Hepatitis C Blood testing is recommended for: Everyone born from 1945 through 1965. Anyone with known risk factors for hepatitis C. Sexually transmitted infections (STIs) You should be screened each year for STIs, including gonorrhea and chlamydia, if: You are sexually active and are younger than 48 years of age. You are older than 48 years of age and your   health care provider tells you that you are at risk for this type of infection. Your sexual activity has changed since you were last screened, and you are at increased risk for chlamydia or gonorrhea. Ask your health care provider if you are at risk. Ask your health care provider about whether you are at high risk for HIV. Your health care provider  may recommend a prescription medicine to help prevent HIV infection. If you choose to take medicine to prevent HIV, you should first get tested for HIV. You should then be tested every 3 months for as long as you are taking the medicine. Follow these instructions at home: Alcohol use Do not drink alcohol if your health care provider tells you not to drink. If you drink alcohol: Limit how much you have to 0-2 drinks a day. Know how much alcohol is in your drink. In the [place] one drink equals one 12 oz bottle of beer (355 mL), one 5 oz glass of wine (148 mL), or one 1 oz glass of hard liquor (44 mL). Lifestyle Do not use any products that contain nicotine or tobacco. These products include cigarettes, chewing tobacco, and vaping devices, such as e-cigarettes. If you need help quitting, ask your health care provider. Do not use street drugs. Do not share needles. Ask your health care provider for help if you need support or information about quitting drugs. General instructions Schedule regular health, dental, and eye exams. Stay current with your vaccines. Tell your health care provider if: You often feel depressed. You have ever been abused or do not feel safe at home. Summary Adopting a healthy lifestyle and getting preventive care are important in promoting health and wellness. Follow your health care provider's instructions about healthy diet, exercising, and getting tested or screened for diseases. Follow your health care provider's instructions on monitoring your cholesterol and blood pressure. This information is not intended to replace advice given to you by your health care provider. Make sure you discuss any questions you have with your health care provider. Document Revised: 06/03/2020 Document Reviewed: 06/03/2020 Elsevier Patient Education  2024 [employer]  

## 2023-01-25 NOTE — Progress Notes (Unsigned)
  Date of Visit: 01/25/2023   SUBJECTIVE:   HPI:  Reiden presents today for a well adult male exam.   Concerns today: *** Sexual activity: *** STD Screening: *** Exercise: *** Diet: *** Smoking: *** Alcohol: *** Drugs: *** Advance directives: *** Mood: *** Cancers in family: *** Dentist: ***  OBJECTIVE:   BP 122/87   Pulse 88   Ht 5\' 8"  (1.727 m)   Wt 173 lb 6.4 oz (78.7 kg)   SpO2 97%   BMI 26.37 kg/m  Gen: NAD, pleasant, cooperative HEENT: NCAT, PERRL, no palpable thyromegaly or anterior cervical lymphadenopathy Heart: RRR, no murmurs Lungs: CTAB, NWOB Abdomen: soft, nontender to palpation Neuro: grossly nonfocal, speech normal GU: ***  ASSESSMENT/PLAN:   Health maintenance:  -STD screening: *** -immunizations: Flu: *** Tdap: *** Shingrix: *** COVID: *** Pneumovax: *** -lipid screening: *** -colon cancer screening: *** -AAA screening: *** -lung cancer screening: *** -prostate cancer screening: *** -advance directives: *** -handout given on health maintenance topics  Assessment & Plan Prediabetes  Screening for colon cancer  Thrombocytopenia (HCC)  Hepatitis B carrier (HCC)   FOLLOW UP: Follow up in *** for ***  [place] J. Pollie Meyer, MD [facility]

## 2023-01-27 DIAGNOSIS — Z0000 Encounter for general adult medical examination without abnormal findings: Secondary | ICD-10-CM | POA: Insufficient documentation

## 2023-01-27 LAB — HEPATITIS B DNA, ULTRAQUANTITATIVE, PCR
HBV DNA SERPL PCR-ACNC: 50 [IU]/mL
HBV DNA SERPL PCR-LOG IU: 1.699 {Log_IU}/mL

## 2023-01-27 LAB — CMP14+EGFR
ALT: 37 [IU]/L (ref 0–44)
AST: 37 [IU]/L (ref 0–40)
Albumin: 4.1 g/dL (ref 4.1–5.1)
Alkaline Phosphatase: 93 [IU]/L (ref 44–121)
BUN/Creatinine Ratio: 11 (ref 9–20)
BUN: 11 mg/dL (ref 6–24)
Bilirubin Total: 0.5 mg/dL (ref 0.0–1.2)
CO2: 22 mmol/L (ref 20–29)
Calcium: 9.4 mg/dL (ref 8.7–10.2)
Chloride: 105 mmol/L (ref 96–106)
Creatinine, Ser: 0.99 mg/dL (ref 0.76–1.27)
Globulin, Total: 2.6 g/dL (ref 1.5–4.5)
Glucose: 85 mg/dL (ref 70–99)
Potassium: 3.9 mmol/L (ref 3.5–5.2)
Sodium: 141 mmol/L (ref 134–144)
Total Protein: 6.7 g/dL (ref 6.0–8.5)
eGFR: 95 mL/min/{1.73_m2} (ref >59)

## 2023-01-27 LAB — LIPID PANEL
Chol/HDL Ratio: 3.1 {ratio} (ref 0.0–5.0)
Cholesterol, Total: 157 mg/dL (ref 100–199)
HDL: 50 mg/dL (ref >39)
LDL Chol Calc (NIH): 90 mg/dL (ref 0–99)
Triglycerides: 92 mg/dL (ref 0–149)
VLDL Cholesterol Cal: 17 mg/dL (ref 5–40)

## 2023-01-27 LAB — CBC
Hematocrit: 44.8 % (ref 37.5–51.0)
Hemoglobin: 14.3 g/dL (ref 13.0–17.7)
MCH: 25.5 pg — ABNORMAL LOW (ref 26.6–33.0)
MCHC: 31.9 g/dL (ref 31.5–35.7)
MCV: 80 fL (ref 79–97)
Platelets: 102 10*3/uL — ABNORMAL LOW (ref 150–450)
RBC: 5.61 x10E6/uL (ref 4.14–5.80)
RDW: 14.7 % (ref 11.6–15.4)
WBC: 4 10*3/uL (ref 3.4–10.8)

## 2023-02-05 ENCOUNTER — Encounter: Payer: Self-pay | Admitting: Family Medicine
